# Patient Record
Sex: Male | Born: 1960 | Race: Black or African American | Hispanic: No | Marital: Married | Smoking: Never smoker
Health system: Southern US, Community
[De-identification: ages and names within clinical notes are randomized; demographics above are authoritative.]

## PROBLEM LIST (undated history)

## (undated) DIAGNOSIS — J302 Other seasonal allergic rhinitis: Secondary | ICD-10-CM

## (undated) DIAGNOSIS — I10 Essential (primary) hypertension: Secondary | ICD-10-CM

## (undated) HISTORY — PX: OTHER SURGICAL HISTORY: SHX169

## (undated) HISTORY — DX: Essential (primary) hypertension: I10

## (undated) HISTORY — PX: HERNIA REPAIR: SHX51

## (undated) HISTORY — DX: Other seasonal allergic rhinitis: J30.2

---

## 2005-07-06 ENCOUNTER — Ambulatory Visit: Payer: Self-pay | Admitting: Family Medicine

## 2005-07-12 ENCOUNTER — Ambulatory Visit: Payer: Self-pay | Admitting: Nurse Practitioner

## 2005-09-07 ENCOUNTER — Ambulatory Visit: Payer: Self-pay | Admitting: Nurse Practitioner

## 2005-11-23 ENCOUNTER — Ambulatory Visit: Payer: Self-pay | Admitting: Nurse Practitioner

## 2006-01-30 ENCOUNTER — Ambulatory Visit: Payer: Self-pay | Admitting: Nurse Practitioner

## 2006-07-19 ENCOUNTER — Ambulatory Visit: Payer: Self-pay | Admitting: Nurse Practitioner

## 2007-07-25 ENCOUNTER — Emergency Department (HOSPITAL_COMMUNITY): Admission: EM | Admit: 2007-07-25 | Discharge: 2007-07-25 | Payer: Self-pay | Admitting: Family Medicine

## 2007-08-06 ENCOUNTER — Emergency Department (HOSPITAL_COMMUNITY): Admission: EM | Admit: 2007-08-06 | Discharge: 2007-08-06 | Payer: Self-pay | Admitting: Emergency Medicine

## 2007-08-22 ENCOUNTER — Encounter (INDEPENDENT_AMBULATORY_CARE_PROVIDER_SITE_OTHER): Payer: Self-pay | Admitting: Nurse Practitioner

## 2007-08-22 ENCOUNTER — Ambulatory Visit: Payer: Self-pay | Admitting: Internal Medicine

## 2007-08-22 LAB — CONVERTED CEMR LAB
AST: 16 units/L (ref 0–37)
Alkaline Phosphatase: 28 units/L — ABNORMAL LOW (ref 39–117)
BUN: 16 mg/dL (ref 6–23)
Basophils Relative: 0 % (ref 0–1)
Creatinine, Ser: 0.97 mg/dL (ref 0.40–1.50)
Eosinophils Absolute: 0.5 10*3/uL (ref 0.0–0.7)
Eosinophils Relative: 9 % — ABNORMAL HIGH (ref 0–5)
Glucose, Bld: 208 mg/dL — ABNORMAL HIGH (ref 70–99)
HCT: 43.4 % (ref 39.0–52.0)
HDL: 48 mg/dL (ref >39)
Hemoglobin: 14.1 g/dL (ref 13.0–17.0)
LDL Cholesterol: 104 mg/dL — ABNORMAL HIGH (ref 0–99)
Lymphs Abs: 2.1 10*3/uL (ref 0.7–4.0)
MCHC: 32.5 g/dL (ref 30.0–36.0)
MCV: 91.4 fL (ref 78.0–100.0)
Monocytes Absolute: 0.3 10*3/uL (ref 0.1–1.0)
Monocytes Relative: 6 % (ref 3–12)
Neutrophils Relative %: 45 % (ref 43–77)
RBC: 4.75 M/uL (ref 4.22–5.81)
TSH: 1.073 microintl units/mL (ref 0.350–5.50)
Total CHOL/HDL Ratio: 3.4
Triglycerides: 56 mg/dL (ref <150)

## 2008-04-14 ENCOUNTER — Ambulatory Visit: Payer: Self-pay | Admitting: Family Medicine

## 2009-01-11 ENCOUNTER — Ambulatory Visit: Payer: Self-pay | Admitting: Internal Medicine

## 2009-01-11 ENCOUNTER — Encounter (INDEPENDENT_AMBULATORY_CARE_PROVIDER_SITE_OTHER): Payer: Self-pay | Admitting: Internal Medicine

## 2009-01-11 LAB — CONVERTED CEMR LAB
AST: 14 units/L (ref 0–37)
Albumin: 4.6 g/dL (ref 3.5–5.2)
Alkaline Phosphatase: 28 units/L — ABNORMAL LOW (ref 39–117)
BUN: 12 mg/dL (ref 6–23)
Calcium: 9.4 mg/dL (ref 8.4–10.5)
Chloride: 99 meq/L (ref 96–112)
Creatinine, Ser: 0.95 mg/dL (ref 0.40–1.50)
Glucose, Bld: 275 mg/dL — ABNORMAL HIGH (ref 70–99)
HDL: 71 mg/dL (ref >39)
PSA: 1.11 ng/mL (ref 0.10–4.00)
Potassium: 4.4 meq/L (ref 3.5–5.3)
Total CHOL/HDL Ratio: 2.7
Triglycerides: 62 mg/dL (ref <150)

## 2009-01-12 ENCOUNTER — Encounter (INDEPENDENT_AMBULATORY_CARE_PROVIDER_SITE_OTHER): Payer: Self-pay | Admitting: Internal Medicine

## 2009-01-25 ENCOUNTER — Ambulatory Visit: Payer: Self-pay | Admitting: Internal Medicine

## 2009-03-11 ENCOUNTER — Ambulatory Visit: Payer: Self-pay | Admitting: Internal Medicine

## 2011-04-18 ENCOUNTER — Ambulatory Visit: Payer: Worker's Compensation

## 2011-04-26 ENCOUNTER — Ambulatory Visit: Payer: Self-pay

## 2011-06-11 ENCOUNTER — Ambulatory Visit (INDEPENDENT_AMBULATORY_CARE_PROVIDER_SITE_OTHER): Payer: 59 | Admitting: Family Medicine

## 2011-06-11 VITALS — BP 136/82 | HR 76 | Temp 98.8°F | Resp 16 | Ht 69.0 in | Wt 211.2 lb

## 2011-06-11 DIAGNOSIS — R05 Cough: Secondary | ICD-10-CM

## 2011-06-11 DIAGNOSIS — J310 Chronic rhinitis: Secondary | ICD-10-CM

## 2011-06-11 DIAGNOSIS — R059 Cough, unspecified: Secondary | ICD-10-CM

## 2011-06-11 DIAGNOSIS — E119 Type 2 diabetes mellitus without complications: Secondary | ICD-10-CM

## 2011-06-11 DIAGNOSIS — I10 Essential (primary) hypertension: Secondary | ICD-10-CM

## 2011-06-11 LAB — POCT GLYCOSYLATED HEMOGLOBIN (HGB A1C): Hemoglobin A1C: 10.1

## 2011-06-11 MED ORDER — FLUTICASONE PROPIONATE 50 MCG/ACT NA SUSP: 2.0000 | Freq: Every day | NASAL | Status: DC

## 2011-06-11 MED ORDER — BENZONATATE 200 MG PO CAPS: 200.0000 mg | ORAL_CAPSULE | Freq: Two times a day (BID) | ORAL | Status: AC | PRN

## 2011-06-11 MED ORDER — GLYBURIDE 5 MG PO TABS: 5.0000 mg | ORAL_TABLET | Freq: Every day | ORAL | Status: DC

## 2011-06-11 MED ORDER — METFORMIN HCL 500 MG PO TABS: 1000.0000 mg | ORAL_TABLET | Freq: Two times a day (BID) | ORAL | Status: DC

## 2011-06-11 MED ORDER — LISINOPRIL 20 MG PO TABS: 20.0000 mg | ORAL_TABLET | Freq: Every day | ORAL | Status: DC

## 2011-06-11 NOTE — Progress Notes (Signed)
[facility]:  Office Visit  Chief Complaint:  Chief Complaint  Patient presents with  . Cough    x 2 days  . Diabetes    med refill  . Hypertension    med refill    HPI: Zachary Walsh is a 51 y.o. male who complains of :  1. Cough x 3 days with brown productive sputum, runny nose; no sinus pressure , no fevers, no ear pain, + sore throat but gargled with listerine and went away. + seasnal allergies but no asthma. Tried vicks vaopr rub.  Cough increases in the pm. 2. Diabetes and HTN med refill. Takes meds regular but ran out of glyburide. Takes BP at home 120-125/80 and blood sugars 200s due to not having glyburide. NO hypoglycemic event, lowest reading in last 30 days has been 98. No neuropathy, last podiatry visit 2 weeks ago, last eye exam < 1 year ago . Denies neuropathy.   Past Medical History  Diagnosis Date  . Hypertension   . Diabetes mellitus    Past Surgical History  Procedure Date  . Right leg fracture    History   Social History  . Marital Status: Married    Spouse Name: N/A    Number of Children: N/A  . Years of Education: N/A   Social History Main Topics  . Smoking status: Never Smoker   . Smokeless tobacco: None  . Alcohol Use: No  . Drug Use: No  . Sexually Active: None   Other Topics Concern  . None   Social History Narrative  . None   Family History  Problem Relation Age of Onset  . Heart disease Mother   . Heart disease Father   . Diabetes Father    Allergies  Allergen Reactions  . Benadryl (Altaryl) Anxiety   Prior to Admission medications   Medication Sig Start Date End Date Taking? Authorizing Provider  glyBURIDE (DIABETA) 5 MG tablet Take 5 mg by mouth daily with breakfast.   Yes Historical Provider, MD  lisinopril (PRINIVIL,ZESTRIL) 20 MG tablet Take 20 mg by mouth daily.   Yes Historical Provider, MD  metFORMIN (GLUCOPHAGE) 500 MG tablet Take 500 mg by mouth 3 (three) times daily with meals.   Yes Historical  Provider, MD     ROS: The patient denies fevers, chills, night sweats, unintentional weight loss, chest pain, palpitations, wheezing, dyspnea on exertion, nausea, vomiting, abdominal pain, dysuria, hematuria, melena, numbness, weakness, or tingling. + cough  All other systems have been reviewed and were otherwise negative with the exception of those mentioned in the HPI and as above.    PHYSICAL EXAM: Filed Vitals:   06/11/11 0825  BP: 136/82  Pulse: 76  Temp: 98.8 F (37.1 C)  Resp: 16   Filed Vitals:   06/11/11 0825  Height: 5\' 9"  (1.753 m)  Weight: 211 lb 3.2 oz (95.8 kg)   Body mass index is 31.19 kg/(m^2).  General: Alert, no acute distress HEENT:  Normocephalic, atraumatic, oropharynx patent.+ erythematous nasal nares, OP, no exudates. TM normal, no sinus tenderness Cardiovascular:  Regular rate and rhythm, no rubs murmurs or gallops.  No Carotid bruits, radial pulse intact. No pedal edema.  Respiratory: Clear to auscultation bilaterally.  No wheezes, rales, or rhonchi.  No cyanosis, no use of accessory musculature GI: No organomegaly, abdomen is soft and non-tender, positive bowel sounds.  No masses. Skin: No rashes. Neurologic: Facial musculature symmetric. Psychiatric: Patient is appropriate throughout our interaction. Lymphatic: No cervical  lymphadenopathy Musculoskeletal: Gait intact. [place] sensation intact     ASSESSMENT/PLAN: Encounter Diagnoses  Name Primary?  . Cough Yes  . Hypertension   . Diabetes mellitus   . Rhinitis    1. Rx flonase and [employer]. Pt declined Tussionex. 2. HTN-at goal. Refill Lisinopril #5, pt would like to get all labs in 3 months 3. T2DM-not well controlled > 200s, HbA1c today was 10.1 ( 1 point lower than last time) he has not been on meds for 1 month. Would like to get labs in 3 months after being on meds.  Eye and foot exam UTD. Refilled Gyburide #5, Increased Metformin from 500 mg TID to 1000 mg BID.   F/u in 3 months for  lab work, fasting , CMP, lipids, TSH, HbA1c.     Jaber Dunlow PHUONG, DO 06/11/2011 9:24 AM

## 2011-06-13 ENCOUNTER — Other Ambulatory Visit: Payer: Self-pay

## 2011-06-13 DIAGNOSIS — R05 Cough: Secondary | ICD-10-CM

## 2011-06-13 DIAGNOSIS — J310 Chronic rhinitis: Secondary | ICD-10-CM

## 2011-06-13 DIAGNOSIS — I10 Essential (primary) hypertension: Secondary | ICD-10-CM

## 2011-06-13 MED ORDER — METFORMIN HCL 500 MG PO TABS: 500.0000 mg | ORAL_TABLET | Freq: Two times a day (BID) | ORAL | Status: DC

## 2011-10-11 ENCOUNTER — Ambulatory Visit (INDEPENDENT_AMBULATORY_CARE_PROVIDER_SITE_OTHER): Payer: 59 | Admitting: Physician Assistant

## 2011-10-11 VITALS — BP 139/81 | HR 75 | Temp 98.0°F | Resp 18 | Ht 70.0 in | Wt 218.0 lb

## 2011-10-11 DIAGNOSIS — I10 Essential (primary) hypertension: Secondary | ICD-10-CM

## 2011-10-11 LAB — BASIC METABOLIC PANEL
CO2: 30 mEq/L (ref 19–32)
Chloride: 101 mEq/L (ref 96–112)
Glucose, Bld: 239 mg/dL — ABNORMAL HIGH (ref 70–99)
Sodium: 139 mEq/L (ref 135–145)

## 2011-10-11 LAB — POCT GLYCOSYLATED HEMOGLOBIN (HGB A1C): Hemoglobin A1C: 10.5

## 2011-10-11 MED ORDER — GLYBURIDE 5 MG PO TABS: 10.0000 mg | ORAL_TABLET | Freq: Every day | ORAL | Status: DC

## 2011-10-11 MED ORDER — METFORMIN HCL 1000 MG PO TABS: 1000.0000 mg | ORAL_TABLET | Freq: Two times a day (BID) | ORAL | Status: DC

## 2011-10-11 MED ORDER — LISINOPRIL-HYDROCHLOROTHIAZIDE 20-12.5 MG PO TABS: 1.0000 | ORAL_TABLET | Freq: Every day | ORAL | Status: DC

## 2011-10-11 NOTE — Progress Notes (Signed)
Patient ID: Zachary Walsh MRN: [id number], DOB: 12/07/1960, 51 y.o. Date of Encounter: 10/11/2011, 10:52 AM  Primary Physician: No primary provider on file.  Chief Complaint: Diabetes follow up  HPI: 51 y.o. year old male with history below presents for follow up of diabetes mellitus. Doing well. No issues or complaints. Taking medications daily without adverse effects. Out of his Metformin this morning. Blood sugars in the morning in the low 200's. Later in the day if he checks them are in the 120-140 range. Not eating healthy. Lots of late night snacking of sweets. Did recently join a gym to exercise. No polydipsia, polyphagia, polyuria, or nocturia.   He does request that I refill his medications to last until February, 2014 secondary to insurance reasons. He will have insurance at that time and plans to come in for a full CPE with fasting labs in February.  Last A1C: 10.1 in February, 2013  Eye MD: Up to date DDS: Up to date Podiatry: Up to date Influenza vaccine: Will need in the fall Pneumococcal vaccine: Needs   Past Medical History  Diagnosis Date  . Hypertension   . Diabetes mellitus      Home Meds: Prior to Admission medications   Medication Sig Start Date End Date Taking? Authorizing Provider  lisinopril (PRINIVIL,ZESTRIL) 20 MG tablet Take 1 tablet (20 mg total) by mouth daily. 06/11/11  Yes Thao P Le, DO  fluticasone (FLONASE) 50 MCG/ACT nasal spray Place 2 sprays into the nose daily. 06/11/11 07/08/12 No Thao P Le, DO  glyBURIDE (DIABETA) 5 MG tablet Take 2 tablets (10 mg total) by mouth daily with breakfast. 10/11/11 10/10/12 Yes Michalene Debruler M Travarus Trudo, PA-C  lisinopril-hydrochlorothiazide (ZESTORETIC) 20-12.5 MG per tablet Take 1 tablet by mouth daily. 10/11/11 10/10/12 Yes Lambros Cerro M Murriel Eidem, PA-C  metFORMIN (GLUCOPHAGE) 1000 MG tablet Take 1 tablet (1,000 mg total) by mouth 2 (two) times daily with a meal. 10/11/11 10/10/12 Yes Shylah Dossantos Adria Devon, PA-C    Allergies:  Allergies  Allergen  Reactions  . Benadryl (Diphenhydramine Hcl) Anxiety    History   Social History  . Marital Status: Married    Spouse Name: N/A    Number of Children: N/A  . Years of Education: N/A   Occupational History  . Not on file.   Social History Main Topics  . Smoking status: Never Smoker   . Smokeless tobacco: Not on file  . Alcohol Use: No  . Drug Use: No  . Sexually Active: Not on file   Other Topics Concern  . Not on file   Social History Narrative  . No narrative on file     Review of Systems: Constitutional: negative for chills, fever, night sweats, weight changes, or fatigue  HEENT: negative for vision changes, hearing loss, congestion, rhinorrhea, or epistaxis Cardiovascular: negative for chest pain, palpitations, diaphoresis, DOE, orthopnea, or edema Respiratory: negative for hemoptysis, wheezing, shortness of breath, dyspnea, or cough Abdominal: negative for abdominal pain, nausea, vomiting, diarrhea, or constipation Dermatological: negative for rash, erythema, or wounds Neurologic: negative for headache, dizziness, or syncope All other systems reviewed and are otherwise negative with the exception to those above and in the HPI.   Physical Exam: Blood pressure 139/81, pulse 75, temperature 98 F (36.7 C), temperature source Oral, resp. rate 18, height 5\' 10"  (1.778 m), weight 218 lb (98.884 kg), SpO2 98.00%., Body mass index is 31.28 kg/(m^2). General: Well developed, well nourished, in no acute distress. Head: Normocephalic, atraumatic, eyes without discharge, sclera non-icteric, nares  are without discharge. Bilateral auditory canals clear, TM's are without perforation, pearly grey and translucent with reflective cone of light bilaterally. Oral cavity moist, posterior pharynx without exudate, erythema, peritonsillar abscess, or post nasal drip.  Neck: Supple. No thyromegaly. Full ROM. No lymphadenopathy. No carotid bruits. Lungs: Clear bilaterally to auscultation  without wheezes, rales, or rhonchi. Breathing is unlabored. Heart: RRR with S1 S2. No murmurs, rubs, or gallops appreciated. Msk:  Strength and tone normal for age. Extremities/Skin: Warm and dry. No clubbing or cyanosis. No edema. No rashes, wounds, or suspicious lesions. Monofilament exam unremarkable bilaterally.  Neuro: Alert and oriented X 3. Moves all extremities spontaneously. Gait is normal. CNII-XII grossly in tact. Psych:  Responds to questions appropriately with a normal affect.   Labs: Results for orders placed in visit on 10/11/11  POCT GLYCOSYLATED HEMOGLOBIN (HGB A1C)      Component Value Range   Hemoglobin A1C 10.5    Last A1C 10.1 in February, 2013  BMP pending Declines all further lab testing  ASSESSMENT AND PLAN:  51 y.o. year old male with uncontrolled diabetes mellitus and hypertension -Patient is insistent on getting refills to last until Feb 2014 when he health insurance kicks in. He understands this is not how we usually follow diabetes, especially someone who is uncontrolled.  -Increase Metformin to 1000 mg 1 po bid #60 RF 7 -Increase Glyburide to 5 mg 2 po QAM #60 RF 7 -Add Lisinopril/HCTZ 20/12.5 mg 1 po daily #30 RF 7 -Take ASA 81 mg daily -Up to date with eye MD exam, podiatry, and DDS exam -Cut out all late night snacking -Healthy diet -Regular exercise -He does need a CPE including fasting labs -Discussed with him in detail this needs to be done. He most certainly needs to be on a statin, but declines this today, as well as lipid panel.  Signed, Eula Listen, PA-C 10/11/2011 10:52 AM

## 2012-01-11 ENCOUNTER — Emergency Department (HOSPITAL_COMMUNITY)
Admission: EM | Admit: 2012-01-11 | Discharge: 2012-01-11 | Disposition: A | Discharge status: Home / Self Care | Payer: No Typology Code available for payment source | Attending: Emergency Medicine | Admitting: Emergency Medicine

## 2012-01-11 ENCOUNTER — Emergency Department (HOSPITAL_COMMUNITY): Payer: No Typology Code available for payment source

## 2012-01-11 ENCOUNTER — Encounter (HOSPITAL_COMMUNITY): Payer: Self-pay

## 2012-01-11 DIAGNOSIS — E119 Type 2 diabetes mellitus without complications: Secondary | ICD-10-CM | POA: Insufficient documentation

## 2012-01-11 DIAGNOSIS — I10 Essential (primary) hypertension: Secondary | ICD-10-CM | POA: Insufficient documentation

## 2012-01-11 DIAGNOSIS — S161XXA Strain of muscle, fascia and tendon at neck level, initial encounter: Secondary | ICD-10-CM

## 2012-01-11 DIAGNOSIS — S139XXA Sprain of joints and ligaments of unspecified parts of neck, initial encounter: Secondary | ICD-10-CM | POA: Insufficient documentation

## 2012-01-11 MED ORDER — ACETAMINOPHEN 325 MG PO TABS
650.0000 mg | ORAL_TABLET | Freq: Once | ORAL | Status: AC
  Administered 2012-01-11: 650 mg via ORAL
  Filled 2012-01-11: qty 2
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
## 2012-05-30 ENCOUNTER — Ambulatory Visit: Payer: Self-pay

## 2012-06-06 ENCOUNTER — Emergency Department (HOSPITAL_COMMUNITY): Payer: Self-pay

## 2012-06-06 ENCOUNTER — Encounter (HOSPITAL_COMMUNITY): Payer: Self-pay | Admitting: Emergency Medicine

## 2012-06-06 ENCOUNTER — Emergency Department (HOSPITAL_COMMUNITY)
Admission: EM | Admit: 2012-06-06 | Discharge: 2012-06-06 | Disposition: A | Discharge status: Home / Self Care | Payer: Self-pay | Attending: Emergency Medicine | Admitting: Emergency Medicine

## 2012-06-06 DIAGNOSIS — E119 Type 2 diabetes mellitus without complications: Secondary | ICD-10-CM | POA: Insufficient documentation

## 2012-06-06 DIAGNOSIS — IMO0001 Reserved for inherently not codable concepts without codable children: Secondary | ICD-10-CM | POA: Insufficient documentation

## 2012-06-06 DIAGNOSIS — Z7982 Long term (current) use of aspirin: Secondary | ICD-10-CM | POA: Insufficient documentation

## 2012-06-06 DIAGNOSIS — J159 Unspecified bacterial pneumonia: Secondary | ICD-10-CM | POA: Insufficient documentation

## 2012-06-06 DIAGNOSIS — Z79899 Other long term (current) drug therapy: Secondary | ICD-10-CM | POA: Insufficient documentation

## 2012-06-06 DIAGNOSIS — J189 Pneumonia, unspecified organism: Secondary | ICD-10-CM

## 2012-06-06 DIAGNOSIS — R059 Cough, unspecified: Secondary | ICD-10-CM | POA: Insufficient documentation

## 2012-06-06 DIAGNOSIS — R05 Cough: Secondary | ICD-10-CM | POA: Insufficient documentation

## 2012-06-06 DIAGNOSIS — I10 Essential (primary) hypertension: Secondary | ICD-10-CM | POA: Insufficient documentation

## 2012-06-06 MED ORDER — METFORMIN HCL 1000 MG PO TABS: 1000.0000 mg | ORAL_TABLET | Freq: Two times a day (BID) | ORAL | Status: DC

## 2012-06-06 MED ORDER — GLYBURIDE 5 MG PO TABS: 10.0000 mg | ORAL_TABLET | Freq: Every day | ORAL | Status: DC

## 2012-06-06 MED ORDER — LISINOPRIL-HYDROCHLOROTHIAZIDE 20-12.5 MG PO TABS: 1.0000 | ORAL_TABLET | Freq: Every day | ORAL | Status: DC

## 2012-06-06 MED ORDER — ACETAMINOPHEN 325 MG PO TABS
650.0000 mg | ORAL_TABLET | Freq: Once | ORAL | Status: AC
  Administered 2012-06-06: 650 mg via ORAL
  Filled 2012-06-06: qty 2

## 2012-06-06 MED ORDER — AMOXICILLIN 500 MG PO CAPS: 1000.0000 mg | ORAL_CAPSULE | Freq: Two times a day (BID) | ORAL | Status: DC

## 2012-06-06 MED ORDER — AMOXICILLIN 500 MG PO CAPS
1000.0000 mg | ORAL_CAPSULE | Freq: Once | ORAL | Status: AC
  Administered 2012-06-06: 1000 mg via ORAL
  Filled 2012-06-06 (×2): qty 1

## 2012-06-06 NOTE — ED Provider Notes (Signed)
History     CSN: [id number]  Arrival date & time 06/06/12  1215   First MD Initiated Contact with Patient 06/06/12 1544      Chief Complaint  Patient presents with  . flu like symptoms     (Consider location/radiation/quality/duration/timing/severity/associated sxs/prior treatment) The history is provided by the patient.   52 year old male had onset yesterday of fever, chills, cough, body aches. Pain is moderate and he rates it at 6/10. Cough is productive of brown sputum. He denies dyspnea. He did not take his temperature at noon he felt hot. He denies having any sick contacts. He denies nausea vomiting or diarrhea. He has not taken any medication.  Past Medical History  Diagnosis Date  . Hypertension   . Diabetes mellitus     Past Surgical History  Procedure Laterality Date  . Right leg fracture      Family History  Problem Relation Age of Onset  . Heart disease Mother   . Heart disease Father   . Diabetes Father     History  Substance Use Topics  . Smoking status: Never Smoker   . Smokeless tobacco: Not on file  . Alcohol Use: No      Review of Systems  All other systems reviewed and are negative.    Allergies  Benadryl  Home Medications   Current Outpatient Rx  Name  Route  Sig  Dispense  Refill  . aspirin 81 MG chewable tablet   Oral   Chew 81 mg by mouth daily.         . fluticasone (FLONASE) 50 MCG/ACT nasal spray   Nasal   Place 2 sprays into the nose daily as needed for allergies.         Marland Kitchen glyBURIDE (DIABETA) 5 MG tablet   Oral   Take 2 tablets (10 mg total) by mouth daily with breakfast.   60 tablet   7   . lisinopril-hydrochlorothiazide (ZESTORETIC) 20-12.5 MG per tablet   Oral   Take 1 tablet by mouth daily.   30 tablet   7   . metFORMIN (GLUCOPHAGE) 1000 MG tablet   Oral   Take 1 tablet (1,000 mg total) by mouth 2 (two) times daily with a meal.   60 tablet   7     BP 104/60  Pulse 83  Temp(Src) 100.6 F (38.1 C)  (Oral)  Resp 18  SpO2 97%  Physical Exam  Nursing note and vitals reviewed.  52 year old male, resting comfortably and in no acute distress. Vital signs are significant for fever with temperature 100.6. Oxygen saturation is 97%, which is normal. Head is normocephalic and atraumatic. PERRLA, EOMI. Oropharynx is clear. Neck is nontender and supple without adenopathy or JVD. Back is nontender and there is no CVA tenderness. Lungs are clear without rales, wheezes, or rhonchi. Chest is nontender. Heart has regular rate and rhythm without murmur. Abdomen is soft, flat, nontender without masses or hepatosplenomegaly and peristalsis is normoactive. Extremities have no cyanosis or edema, full range of motion is present. Skin is warm and dry without rash. Neurologic: Mental status is normal, cranial nerves are intact, there are no motor or sensory deficits.  ED Course  Procedures (including critical care time)  Labs Reviewed - No data to display Dg Chest 2 View  06/06/2012  *RADIOLOGY REPORT*  Clinical Data: Cough, fever  CHEST - 2 VIEW  Comparison: None.  Findings: Cardiomediastinal silhouette is unremarkable.  Mild elevation of the right hemidiaphragm.  Right basilar atelectasis or infiltrate.  No pulmonary edema.  IMPRESSION: No pulmonary edema.  Elevation of the right hemidiaphragm.  Right basilar atelectasis or infiltrate.   Original Report Authenticated By: Natasha Mead, M.D.    Images viewed by me.  1. Community acquired pneumonia       MDM  Community acquired pneumonia. I reviewed the x-rays and appearance is much more consistent with pneumonia than atelectasis especially in conjunction with his clinical presentation. He is given a prescription for amoxicillin. He also states that he will not be seeing his new PCP until sometime next month and requests refills of his prescriptions. Refills are given for a one-month supply of glyburide, lisinopril-hydrochlorothiazide, and metformin. He is  given a work release for the next 2 days.        Dione Booze, MD 06/06/12 [phone]

## 2012-06-06 NOTE — ED Notes (Signed)
Per patient, flu like symptoms since yesterday-productive cough, chills, head ache

## 2012-06-13 NOTE — ED Notes (Addendum)
Patient called requesting extension of work note.Note extended, to return to work on 06/14/2012

## 2012-08-03 ENCOUNTER — Telehealth: Payer: Self-pay

## 2012-08-03 NOTE — Telephone Encounter (Signed)
PT CALLED IN REGARDS TO A REFILL FOR HIS DIABETES AND HIGH BLOOD PRESSURE MEDICATION.

## 2012-08-03 NOTE — Telephone Encounter (Signed)
Does he need to RTC? 

## 2012-08-04 MED ORDER — METFORMIN HCL 1000 MG PO TABS: 1000.0000 mg | ORAL_TABLET | Freq: Two times a day (BID) | ORAL | Status: DC

## 2012-08-04 MED ORDER — LISINOPRIL-HYDROCHLOROTHIAZIDE 20-12.5 MG PO TABS: 1.0000 | ORAL_TABLET | Freq: Every day | ORAL | Status: DC

## 2012-08-04 MED ORDER — GLYBURIDE 5 MG PO TABS: 10.0000 mg | ORAL_TABLET | Freq: Every day | ORAL | Status: DC

## 2012-08-04 NOTE — Telephone Encounter (Signed)
Called him to advise meds sent in needs follow up.

## 2012-08-04 NOTE — Telephone Encounter (Signed)
I have sent a 1 month supply, but pt needs appt and labs for further refills.

## 2013-02-11 ENCOUNTER — Encounter (HOSPITAL_COMMUNITY): Payer: Self-pay | Admitting: Emergency Medicine

## 2013-02-11 ENCOUNTER — Emergency Department (HOSPITAL_COMMUNITY)
Admission: EM | Admit: 2013-02-11 | Discharge: 2013-02-11 | Disposition: A | Discharge status: Home / Self Care | Payer: 59 | Attending: Emergency Medicine | Admitting: Emergency Medicine

## 2013-02-11 ENCOUNTER — Emergency Department (HOSPITAL_COMMUNITY): Payer: 59

## 2013-02-11 DIAGNOSIS — I10 Essential (primary) hypertension: Secondary | ICD-10-CM | POA: Insufficient documentation

## 2013-02-11 DIAGNOSIS — E119 Type 2 diabetes mellitus without complications: Secondary | ICD-10-CM | POA: Insufficient documentation

## 2013-02-11 DIAGNOSIS — Z792 Long term (current) use of antibiotics: Secondary | ICD-10-CM | POA: Insufficient documentation

## 2013-02-11 DIAGNOSIS — Z79899 Other long term (current) drug therapy: Secondary | ICD-10-CM | POA: Insufficient documentation

## 2013-02-11 DIAGNOSIS — L089 Local infection of the skin and subcutaneous tissue, unspecified: Secondary | ICD-10-CM | POA: Insufficient documentation

## 2013-02-11 DIAGNOSIS — Z7982 Long term (current) use of aspirin: Secondary | ICD-10-CM | POA: Insufficient documentation

## 2013-02-11 NOTE — ED Notes (Signed)
Pt states that his left 4th toe was sore on Sat; pt states that he does not remember an injury; pt states that he saw his MD on Monday and was diagnosed with an infection to toe and was placed on an antibiotic (pt does not remember the name); pt states that his toe remains painful and swollen.

## 2013-02-11 NOTE — ED Provider Notes (Signed)
Medical screening examination/treatment/procedure(s) were performed by non-physician practitioner and as supervising physician I was immediately available for consultation/collaboration.  Jennifr Gaeta, MD 02/11/13 0651 

## 2013-02-11 NOTE — ED Provider Notes (Signed)
CSN: [id number]     Arrival date & time 02/11/13  4098 History   First MD Initiated Contact with Patient 02/11/13 [phone]     Chief Complaint  Patient presents with  . Toe Pain   HPI  History provided by the patient. Patient is a 52 year old male with history of hypertension and diabetes who presents with complaints of continued and worsened left toe pain. Patient reports having increasing toe pain first began over the past weekend 4 or 5 days ago. He was seen 2 days ago, Monday at a local [facility] and diagnosed with possible infection of his left fourth toe. He reports being given prescriptions for an antibiotic, platelet the skin of the toes and pain medicine. He has been taking these but tonight reports worsened pain in the toe. He states he is worried about toe being broken. He does not remember hitting the toe or having any injury prior to symptoms. Patient does walk a lot as he works for the [employer]. No other aggravating or alleviating factors. No other associated symptoms.      Past Medical History  Diagnosis Date  . Hypertension   . Diabetes mellitus    Past Surgical History  Procedure Laterality Date  . Right leg fracture    . Hernia repair     Family History  Problem Relation Age of Onset  . Heart disease Mother   . Heart disease Father   . Diabetes Father    History  Substance Use Topics  . Smoking status: Never Smoker   . Smokeless tobacco: Not on file  . Alcohol Use: No    Review of Systems  Constitutional: Negative for fever, chills and diaphoresis.  Neurological: Negative for weakness and numbness.  All other systems reviewed and are negative.    Allergies  Benadryl  Home Medications   Current Outpatient Rx  Name  Route  Sig  Dispense  Refill  . amoxicillin (AMOXIL) 500 MG capsule   Oral   Take 2 capsules (1,000 mg total) by mouth 2 (two) times daily.   40 capsule   0   . aspirin 81 MG chewable tablet   Oral   Chew 81 mg by mouth daily.        . fluticasone (FLONASE) 50 MCG/ACT nasal spray   Nasal   Place 2 sprays into the nose daily as needed for allergies.         Marland Kitchen EXPIRED: glyBURIDE (DIABETA) 5 MG tablet   Oral   Take 2 tablets (10 mg total) by mouth daily with breakfast.   60 tablet   7   . glyBURIDE (DIABETA) 5 MG tablet   Oral   Take 2 tablets (10 mg total) by mouth daily with breakfast.   60 tablet   0   . lisinopril-hydrochlorothiazide (PRINZIDE) 20-12.5 MG per tablet   Oral   Take 1 tablet by mouth daily.   30 tablet   0   . EXPIRED: lisinopril-hydrochlorothiazide (ZESTORETIC) 20-12.5 MG per tablet   Oral   Take 1 tablet by mouth daily.   30 tablet   7   . EXPIRED: metFORMIN (GLUCOPHAGE) 1000 MG tablet   Oral   Take 1 tablet (1,000 mg total) by mouth 2 (two) times daily with a meal.   60 tablet   7   . metFORMIN (GLUCOPHAGE) 1000 MG tablet   Oral   Take 1 tablet (1,000 mg total) by mouth 2 (two) times daily.   60 tablet  0    BP 146/74  Pulse 73  Temp(Src) 98.9 F (37.2 C) (Oral)  Resp 18  SpO2 99% Physical Exam  Nursing note and vitals reviewed. Constitutional: He is oriented to person, place, and time. He appears well-developed and well-nourished. No distress.  HENT:  Head: Normocephalic.  Cardiovascular: Normal rate and regular rhythm.   Pulmonary/Chest: Effort normal and breath sounds normal.  Abdominal: Soft.  Musculoskeletal:  Diffuse swelling of the left 4th toe with erythema.  Neurological: He is alert and oriented to person, place, and time.  Skin: Skin is warm.  Psychiatric: He has a normal mood and affect. His behavior is normal.    ED Course  Procedures   Patient seen and evaluated. Patient well-appearing no acute distress. Appear severely ill or toxic. He is concerned for other injury to costosternal pain and swelling.  X-rays unremarkable. No signs of fractures, dislocations or osteomyelitis. At this time I have instructed patient to continue Keflex. He has  only been using for one day and this is unlikely to have enough time to take full effect. Patient was also given prescription for pain medicine but does not recall the name. At this time have instructed him to continue the same medicines and recheck if symptoms on Friday.    Imaging Review Dg Foot Complete Left  02/11/2013   CLINICAL DATA:  Left 4th toe pain. No known injury.  EXAM: LEFT FOOT - COMPLETE 3+ VIEW  COMPARISON:  None.  FINDINGS: There is no evidence of fracture or dislocation. There is no evidence of arthropathy or other focal bone abnormality. Soft tissues are unremarkable.  IMPRESSION: Negative.   Electronically Signed   By: Burman Nieves M.D.   On: 02/11/2013 05:06      MDM   1. Toe infection        Angus Seller, PA-C 02/11/13 [phone]

## 2013-11-15 ENCOUNTER — Ambulatory Visit (INDEPENDENT_AMBULATORY_CARE_PROVIDER_SITE_OTHER): Payer: 59 | Admitting: Family Medicine

## 2013-11-15 VITALS — BP 140/74 | HR 74 | Temp 97.5°F | Resp 20 | Ht 70.0 in | Wt 215.0 lb

## 2013-11-15 DIAGNOSIS — E119 Type 2 diabetes mellitus without complications: Secondary | ICD-10-CM

## 2013-11-15 DIAGNOSIS — N529 Male erectile dysfunction, unspecified: Secondary | ICD-10-CM

## 2013-11-15 LAB — COMPREHENSIVE METABOLIC PANEL
ALT: 12 U/L (ref 0–53)
AST: 11 U/L (ref 0–37)
Albumin: 4.2 g/dL (ref 3.5–5.2)
Alkaline Phosphatase: 29 U/L — ABNORMAL LOW (ref 39–117)
BUN: 16 mg/dL (ref 6–23)
CO2: 27 mEq/L (ref 19–32)
Calcium: 9.8 mg/dL (ref 8.4–10.5)
Chloride: 98 mEq/L (ref 96–112)
Creat: 0.93 mg/dL (ref 0.50–1.35)
Glucose, Bld: 302 mg/dL — ABNORMAL HIGH (ref 70–99)
Potassium: 4.3 mEq/L (ref 3.5–5.3)
Sodium: 135 mEq/L (ref 135–145)
Total Bilirubin: 0.7 mg/dL (ref 0.2–1.2)
Total Protein: 7.3 g/dL (ref 6.0–8.3)

## 2013-11-15 LAB — LIPID PANEL
Cholesterol: 237 mg/dL — ABNORMAL HIGH (ref 0–200)
HDL: 65 mg/dL (ref >39)
LDL Cholesterol: 148 mg/dL — ABNORMAL HIGH (ref 0–99)
Total CHOL/HDL Ratio: 3.6 Ratio
Triglycerides: 119 mg/dL (ref <150)
VLDL: 24 mg/dL (ref 0–40)

## 2013-11-15 LAB — POCT GLYCOSYLATED HEMOGLOBIN (HGB A1C): Hemoglobin A1C: 11.2

## 2013-11-15 LAB — GLUCOSE, POCT (MANUAL RESULT ENTRY): POC Glucose: 286 mg/dl — AB (ref 70–99)

## 2013-11-15 LAB — MICROALBUMIN, URINE: Microalb, Ur: 2.76 mg/dL — ABNORMAL HIGH (ref 0.00–1.89)

## 2013-11-15 MED ORDER — SILDENAFIL CITRATE 100 MG PO TABS: 100.0000 mg | ORAL_TABLET | Freq: Every day | ORAL | Status: DC | PRN

## 2013-11-15 MED ORDER — LISINOPRIL-HYDROCHLOROTHIAZIDE 20-12.5 MG PO TABS: 1.0000 | ORAL_TABLET | Freq: Every day | ORAL | Status: DC

## 2013-11-15 MED ORDER — SITAGLIPTIN PHOSPHATE 50 MG PO TABS: 50.0000 mg | ORAL_TABLET | Freq: Every day | ORAL | Status: DC

## 2013-11-15 MED ORDER — METFORMIN HCL 1000 MG PO TABS: 1000.0000 mg | ORAL_TABLET | Freq: Two times a day (BID) | ORAL | Status: DC

## 2013-11-15 MED ORDER — GLYBURIDE 5 MG PO TABS: 10.0000 mg | ORAL_TABLET | Freq: Every day | ORAL | Status: DC

## 2013-11-15 NOTE — Progress Notes (Signed)
Patient ID: Zachary Walsh MRN: [id number], DOB: 1960-04-26, 53 y.o. Date of Encounter: 11/15/2013, 1:03 PM  Primary Physician: No primary provider on file.  Chief Complaint: Diabetes follow up  HPI: 53 y.o. year old male with history below presents for follow up of diabetes mellitus. Doing well. No issues or complaints. Taking medications daily without adverse effects. No polydipsia, polyphagia, polyuria, or nocturia.  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  He's drinking large amount of gatorade  Last check over a year ago.   Past Medical History  Diagnosis Date  . Hypertension   . Diabetes mellitus   . Seasonal allergies      Home Meds: Prior to Admission medications   Medication Sig Start Date End Date Taking? Authorizing Provider  aspirin 81 MG chewable tablet Chew 81 mg by mouth daily.   Yes Historical Provider, MD  glyBURIDE (DIABETA) 5 MG tablet Take 2 tablets (10 mg total) by mouth daily with breakfast. 11/15/13  Yes Elvina SidleKurt Debanhi Blaker, MD  lisinopril-hydrochlorothiazide (PRINZIDE,ZESTORETIC) 20-12.5 MG per tablet Take 1 tablet by mouth daily. 11/15/13  Yes Elvina SidleKurt Teila Skalsky, MD  metFORMIN (GLUCOPHAGE) 1000 MG tablet Take 1 tablet (1,000 mg total) by mouth 2 (two) times daily with a meal. 11/15/13  Yes Elvina SidleKurt Makenlee Mckeag, MD  sildenafil (VIAGRA) 100 MG tablet Take 1 tablet (100 mg total) by mouth daily as needed for erectile dysfunction. 11/15/13  Yes Elvina SidleKurt Aryon Nham, MD    Allergies:  Allergies  Allergen Reactions  . Benadryl [Diphenhydramine Hcl] Anxiety    History   Social History  . Marital Status: Married    Spouse Name: N/A    Number of Children: N/A  . Years of Education: N/A   Occupational History  . Not on file.   Social History Main Topics  . Smoking status: Never Smoker   . Smokeless tobacco: Not on file  . Alcohol Use: No  . Drug Use: No  . Sexual  Activity: Not on file   Other Topics Concern  . Not on file   Social History Narrative  . No narrative on file     Lab Results  Component Value Date   HGBA1C 10.5 10/11/2011    Review of Systems: Constitutional: negative for chills, fever, night sweats, weight changes, or fatigue  HEENT: negative for vision changes, hearing loss, congestion, rhinorrhea, or epistaxis Cardiovascular: negative for chest pain, palpitations, diaphoresis, DOE, orthopnea, or edema Respiratory: negative for hemoptysis, wheezing, shortness of breath, dyspnea, or cough Abdominal: negative for abdominal pain, nausea, vomiting, diarrhea, or constipation Dermatological: negative for rash, erythema, or wounds Neurologic: negative for headache, dizziness, or syncope Renal:  Negative for polyuria, polydipsia, or dysuria All other systems reviewed and are otherwise negative with the exception to those above and in the HPI.   Physical Exam: Blood pressure 140/74, pulse 74, temperature 97.5 F (36.4 C), temperature source Oral, resp. rate 20, height 5\' 10"  (1.778 m), weight 215 lb (97.523 kg), SpO2 98.00%., Body mass index is 30.85 kg/(m^2). Wt Readings from Last 3 Encounters:  11/15/13 215 lb (97.523 kg)  10/11/11 218 lb (98.884 kg)  06/11/11 211 lb 3.2 oz (95.8 kg)   BP Readings from Last 3 Encounters:  11/15/13 140/74  02/11/13 133/79  06/06/12 104/60   General: Well developed, well nourished, in no acute distress. Head: Normocephalic, atraumatic, eyes without discharge, sclera non-icteric, nares are without discharge. Bilateral auditory  canals clear, TM's are without perforation, pearly grey and translucent with reflective cone of light bilaterally. Oral cavity moist, posterior pharynx without exudate, erythema, peritonsillar abscess, or post nasal drip.  Neck: Supple. No thyromegaly. Full ROM. No lymphadenopathy. Lungs: Clear bilaterally to auscultation without wheezes, rales, or rhonchi. Breathing is  unlabored. Heart: RRR with S1 S2. No murmurs, rubs, or gallops appreciated. Abdomen: Soft, non-tender, non-distended with normoactive bowel sounds. No hepatosplenomegaly. No rebound/guarding. No obvious abdominal masses. Msk:  Strength and tone normal for age. Extremities/Skin: Warm and dry. No clubbing or cyanosis. No edema. No rashes, wounds, or suspicious lesions. Monofilament exam normal.  Neuro: Alert and oriented X 3. Moves all extremities spontaneously. Gait is normal. CNII-XII grossly in tact. Psych:  Responds to questions appropriately with a normal affect.   Labs: Results for orders placed in visit on 11/15/13  POCT GLYCOSYLATED HEMOGLOBIN (HGB A1C)      Result Value Ref Range   Hemoglobin A1C 11.2    GLUCOSE, POCT (MANUAL RESULT ENTRY)      Result Value Ref Range   POC Glucose 286 (*) 70 - 99 mg/dl    ASSESSMENT AND PLAN:  53 y.o. year old male with uncontrolled diabetes -Type 2 diabetes mellitus without complication - Plan: glyBURIDE (DIABETA) 5 MG tablet, lisinopril-hydrochlorothiazide (PRINZIDE,ZESTORETIC) 20-12.5 MG per tablet, metFORMIN (GLUCOPHAGE) 1000 MG tablet, Microalbumin, urine, POCT glycosylated hemoglobin (Hb A1C), POCT glucose (manual entry), Comprehensive metabolic panel, Lipid panel, sitaGLIPtin (JANUVIA) 50 MG tablet  Erectile dysfunction, unspecified erectile dysfunction type - Plan: sildenafil (VIAGRA) 100 MG tablet  Recheck 3 months.  Cool it on the gatorade.  Add Januvia  Signed, Elvina Sidle, MD 11/15/2013 1:03 PM

## 2013-11-15 NOTE — Patient Instructions (Signed)
Diabetes and Exercise Exercising regularly is important. It is not just about losing weight. It has many health benefits, such as:  Improving your overall fitness, flexibility, and endurance.  Increasing your bone density.  Helping with weight control.  Decreasing your body fat.  Increasing your muscle strength.  Reducing stress and tension.  Improving your overall health. People with diabetes who exercise gain additional benefits because exercise:  Reduces appetite.  Improves the body's use of blood sugar (glucose).  Helps lower or control blood glucose.  Decreases blood pressure.  Helps control blood lipids (such as cholesterol and triglycerides).  Improves the body's use of the hormone insulin by:  Increasing the body's insulin sensitivity.  Reducing the body's insulin needs.  Decreases the risk for heart disease because exercising:  Lowers cholesterol and triglycerides levels.  Increases the levels of good cholesterol (such as high-density lipoproteins [HDL]) in the body.  Lowers blood glucose levels. YOUR ACTIVITY PLAN  Choose an activity that you enjoy and set realistic goals. Your health care provider or diabetes educator can help you make an activity plan that works for you. Exercise regularly as directed by your health care provider. This includes:  Performing resistance training twice a week such as push-ups, sit-ups, lifting weights, or using resistance bands.  Performing 150 minutes of cardio exercises each week such as walking, running, or playing sports.  Staying active and spending no more than 90 minutes at one time being inactive. Even short bursts of exercise are good for you. Three 10-minute sessions spread throughout the day are just as beneficial as a single 30-minute session. Some exercise ideas include:  Taking the dog for a walk.  Taking the stairs instead of the elevator.  Dancing to your favorite song.  Doing an exercise  video.  Doing your favorite exercise with a friend. RECOMMENDATIONS FOR EXERCISING WITH TYPE 1 OR TYPE 2 DIABETES   Check your blood glucose before exercising. If blood glucose levels are greater than 240 mg/dL, check for urine ketones. Do not exercise if ketones are present.  Avoid injecting insulin into areas of the body that are going to be exercised. For example, avoid injecting insulin into:  The arms when playing tennis.  The legs when jogging.  Keep a record of:  Food intake before and after you exercise.  Expected peak times of insulin action.  Blood glucose levels before and after you exercise.  The type and amount of exercise you have done.  Review your records with your health care provider. Your health care provider will help you to develop guidelines for adjusting food intake and insulin amounts before and after exercising.  If you take insulin or oral hypoglycemic agents, watch for signs and symptoms of hypoglycemia. They include:  Dizziness.  Shaking.  Sweating.  Chills.  Confusion.  Drink plenty of water while you exercise to prevent dehydration or heat stroke. Body water is lost during exercise and must be replaced.  Talk to your health care provider before starting an exercise program to make sure it is safe for you. Remember, almost any type of activity is better than none. Document Released: 06/30/2003 Document Revised: 08/24/2013 Document Reviewed: 09/16/2012 ExitCare Patient Information 2015 [employer]. This information is not intended to replace advice given to you by your health care provider. Make sure you discuss any questions you have with your health care provider.  

## 2014-04-10 ENCOUNTER — Encounter (HOSPITAL_COMMUNITY): Payer: Self-pay | Admitting: Emergency Medicine

## 2014-04-10 ENCOUNTER — Emergency Department (HOSPITAL_COMMUNITY)
Admission: EM | Admit: 2014-04-10 | Discharge: 2014-04-10 | Disposition: A | Discharge status: Home / Self Care | Payer: 59 | Attending: Emergency Medicine | Admitting: Emergency Medicine

## 2014-04-10 ENCOUNTER — Emergency Department (HOSPITAL_COMMUNITY): Payer: 59

## 2014-04-10 DIAGNOSIS — I10 Essential (primary) hypertension: Secondary | ICD-10-CM | POA: Insufficient documentation

## 2014-04-10 DIAGNOSIS — B349 Viral infection, unspecified: Secondary | ICD-10-CM | POA: Insufficient documentation

## 2014-04-10 DIAGNOSIS — Z7982 Long term (current) use of aspirin: Secondary | ICD-10-CM | POA: Insufficient documentation

## 2014-04-10 DIAGNOSIS — E119 Type 2 diabetes mellitus without complications: Secondary | ICD-10-CM | POA: Diagnosis not present

## 2014-04-10 DIAGNOSIS — Z79899 Other long term (current) drug therapy: Secondary | ICD-10-CM | POA: Diagnosis not present

## 2014-04-10 DIAGNOSIS — Z8709 Personal history of other diseases of the respiratory system: Secondary | ICD-10-CM | POA: Insufficient documentation

## 2014-04-10 DIAGNOSIS — R110 Nausea: Secondary | ICD-10-CM | POA: Diagnosis present

## 2014-04-10 LAB — URINALYSIS, ROUTINE W REFLEX MICROSCOPIC
Glucose, UA: >1000 mg/dL — AB
HGB URINE DIPSTICK: NEGATIVE
KETONES UR: NEGATIVE mg/dL
Leukocytes, UA: NEGATIVE
Nitrite: NEGATIVE
PROTEIN: NEGATIVE mg/dL
SPECIFIC GRAVITY, URINE: 1.029 (ref 1.005–1.030)
UROBILINOGEN UA: 1 mg/dL (ref 0.0–1.0)
pH: 5.5 (ref 5.0–8.0)

## 2014-04-10 LAB — CBC WITH DIFFERENTIAL/PLATELET
Basophils Absolute: 0 10*3/uL (ref 0.0–0.1)
Basophils Relative: 0 % (ref 0–1)
EOS ABS: 0.2 10*3/uL (ref 0.0–0.7)
EOS PCT: 3 % (ref 0–5)
HCT: 38.4 % — ABNORMAL LOW (ref 39.0–52.0)
HEMOGLOBIN: 13.4 g/dL (ref 13.0–17.0)
Lymphocytes Relative: 17 % (ref 12–46)
Lymphs Abs: 1 10*3/uL (ref 0.7–4.0)
MCH: 31 pg (ref 26.0–34.0)
MCHC: 34.9 g/dL (ref 30.0–36.0)
MCV: 88.9 fL (ref 78.0–100.0)
MONOS PCT: 5 % (ref 3–12)
Monocytes Absolute: 0.3 10*3/uL (ref 0.1–1.0)
Neutro Abs: 4.3 10*3/uL (ref 1.7–7.7)
Neutrophils Relative %: 75 % (ref 43–77)
PLATELETS: 201 10*3/uL (ref 150–400)
RBC: 4.32 MIL/uL (ref 4.22–5.81)
RDW: 12.6 % (ref 11.5–15.5)
WBC: 5.8 10*3/uL (ref 4.0–10.5)

## 2014-04-10 LAB — BASIC METABOLIC PANEL
Anion gap: 13 (ref 5–15)
BUN: 17 mg/dL (ref 6–23)
CO2: 27 mEq/L (ref 19–32)
CREATININE: 1.05 mg/dL (ref 0.50–1.35)
Calcium: 8.8 mg/dL (ref 8.4–10.5)
Chloride: 95 mEq/L — ABNORMAL LOW (ref 96–112)
GFR calc Af Amer: >90 mL/min (ref >90)
GFR, EST NON AFRICAN AMERICAN: 80 mL/min — AB (ref >90)
Glucose, Bld: 267 mg/dL — ABNORMAL HIGH (ref 70–99)
POTASSIUM: 4.1 meq/L (ref 3.7–5.3)
Sodium: 135 mEq/L — ABNORMAL LOW (ref 137–147)

## 2014-04-10 LAB — CBG MONITORING, ED: Glucose-Capillary: 268 mg/dL — ABNORMAL HIGH (ref 70–99)

## 2014-04-10 LAB — URINE MICROSCOPIC-ADD ON

## 2014-04-10 MED ORDER — ONDANSETRON 8 MG PO TBDP
8.0000 mg | ORAL_TABLET | Freq: Once | ORAL | Status: AC
  Administered 2014-04-10: 8 mg via ORAL
  Filled 2014-04-10: qty 1

## 2014-04-10 MED ORDER — IBUPROFEN 200 MG PO TABS
600.0000 mg | ORAL_TABLET | Freq: Once | ORAL | Status: AC
  Administered 2014-04-10: 600 mg via ORAL
  Filled 2014-04-10: qty 3

## 2014-04-10 MED ORDER — ONDANSETRON 4 MG PO TBDP: ORAL_TABLET | ORAL | Status: DC

## 2014-04-10 NOTE — ED Notes (Signed)
Pt from home, was accompanying daughter who was being tx for nausea. Pt started to feel nauseous and faint. Pt laid in bed and sts that he needed to check in to be seen. Pt denies emesis, only nausea. Pt is A&O and in NAD

## 2014-04-10 NOTE — ED Notes (Signed)
Pt sts that he is not a Jehovah's Witness but he will still not accept blood

## 2014-04-10 NOTE — ED Provider Notes (Signed)
CSN: [id number]     Arrival date & time 04/10/14  1638 History   First MD Initiated Contact with Patient 04/10/14 1707     Chief Complaint  Patient presents with  . Nausea     (Consider location/radiation/quality/duration/timing/severity/associated sxs/prior Treatment) HPI Comments: Patient presents with nausea and fatigue. He has a history of hypertension and diabetes. He's actually here in emergency department with his daughter who has gastroenteritis. He started feeling fatigued this morning and then while he was here in the ER started feeling nauseated and had some diarrhea. He also is noted to have a fever of 101. He's been having some cough and chest congestion. He denies any urinary symptoms. He denies any shortness of breath. He denies any sore throat. He denies abdominal pain.   Past Medical History  Diagnosis Date  . Hypertension   . Diabetes mellitus   . Seasonal allergies    Past Surgical History  Procedure Laterality Date  . Right leg fracture    . Hernia repair     Family History  Problem Relation Age of Onset  . Heart disease Mother   . Heart disease Father   . Diabetes Father   . Heart failure Mother   . Heart failure Father    History  Substance Use Topics  . Smoking status: Never Smoker   . Smokeless tobacco: Not on file  . Alcohol Use: No    Review of Systems  Constitutional: Positive for fever and fatigue. Negative for chills and diaphoresis.  HENT: Negative for congestion, rhinorrhea and sneezing.   Eyes: Negative.   Respiratory: Positive for cough. Negative for chest tightness and shortness of breath.   Cardiovascular: Negative for chest pain and leg swelling.  Gastrointestinal: Positive for nausea and diarrhea. Negative for vomiting, abdominal pain and blood in stool.  Genitourinary: Negative for frequency, hematuria, flank pain and difficulty urinating.  Musculoskeletal: Negative for back pain and arthralgias.  Skin: Negative for rash.   Neurological: Negative for dizziness, speech difficulty, weakness, numbness and headaches.      Allergies  Benadryl  Home Medications   Prior to Admission medications   Medication Sig Start Date End Date Taking? Authorizing Provider  aspirin 81 MG chewable tablet Chew 81 mg by mouth daily.   Yes Historical Provider, MD  glyBURIDE (DIABETA) 5 MG tablet Take 2 tablets (10 mg total) by mouth daily with breakfast. 11/15/13  Yes Elvina Sidle, MD  lisinopril-hydrochlorothiazide (PRINZIDE,ZESTORETIC) 20-12.5 MG per tablet Take 1 tablet by mouth daily. 11/15/13  Yes Elvina Sidle, MD  metFORMIN (GLUCOPHAGE) 1000 MG tablet Take 1 tablet (1,000 mg total) by mouth 2 (two) times daily with a meal. 11/15/13  Yes Elvina Sidle, MD  sitaGLIPtin (JANUVIA) 50 MG tablet Take 1 tablet (50 mg total) by mouth daily. 11/15/13  Yes Elvina Sidle, MD  ondansetron (ZOFRAN ODT) 4 MG disintegrating tablet 4mg  ODT q4 hours prn nausea/vomit 04/10/14   Rolan Bucco, MD  sildenafil (VIAGRA) 100 MG tablet Take 1 tablet (100 mg total) by mouth daily as needed for erectile dysfunction. 11/15/13   Elvina Sidle, MD   BP 132/69 mmHg  Pulse 84  Temp(Src) 100.7 F (38.2 C) (Oral)  Resp 18  SpO2 98% Physical Exam  Constitutional: He is oriented to person, place, and time. He appears well-developed and well-nourished.  HENT:  Head: Normocephalic and atraumatic.  Mouth/Throat: Oropharynx is clear and moist.  Eyes: Pupils are equal, round, and reactive to light.  Neck: Normal range of motion. Neck supple.  Cardiovascular: Normal rate, regular rhythm and normal heart sounds.   Pulmonary/Chest: Effort normal and breath sounds normal. No respiratory distress. He has no wheezes. He has no rales. He exhibits no tenderness.  Abdominal: Soft. Bowel sounds are normal. There is no tenderness. There is no rebound and no guarding.  Musculoskeletal: Normal range of motion. He exhibits no edema.  Lymphadenopathy:    He has  no cervical adenopathy.  Neurological: He is alert and oriented to person, place, and time.  Skin: Skin is warm and dry. No rash noted.  Psychiatric: He has a normal mood and affect.    ED Course  Procedures (including critical care time) Labs Review Results for orders placed or performed during the hospital encounter of 04/10/14  Urinalysis, Routine w reflex microscopic  Result Value Ref Range   Color, Urine YELLOW YELLOW   APPearance CLEAR CLEAR   Specific Gravity, Urine 1.029 1.005 - 1.030   pH 5.5 5.0 - 8.0   Glucose, UA >1000 (A) NEGATIVE mg/dL   Hgb urine dipstick NEGATIVE NEGATIVE   Bilirubin Urine SMALL (A) NEGATIVE   Ketones, ur NEGATIVE NEGATIVE mg/dL   Protein, ur NEGATIVE NEGATIVE mg/dL   Urobilinogen, UA 1.0 0.0 - 1.0 mg/dL   Nitrite NEGATIVE NEGATIVE   Leukocytes, UA NEGATIVE NEGATIVE  Basic metabolic panel  Result Value Ref Range   Sodium 135 (L) 137 - 147 mEq/L   Potassium 4.1 3.7 - 5.3 mEq/L   Chloride 95 (L) 96 - 112 mEq/L   CO2 27 19 - 32 mEq/L   Glucose, Bld 267 (H) 70 - 99 mg/dL   BUN 17 6 - 23 mg/dL   Creatinine, Ser 2.441.05 0.50 - 1.35 mg/dL   Calcium 8.8 8.4 - 01.010.5 mg/dL   GFR calc non Af Amer 80 (L) >90 mL/min   GFR calc Af Amer >90 >90 mL/min   Anion gap 13 5 - 15  CBC with Differential  Result Value Ref Range   WBC 5.8 4.0 - 10.5 K/uL   RBC 4.32 4.22 - 5.81 MIL/uL   Hemoglobin 13.4 13.0 - 17.0 g/dL   HCT 27.238.4 (L) 53.639.0 - 64.452.0 %   MCV 88.9 78.0 - 100.0 fL   MCH 31.0 26.0 - 34.0 pg   MCHC 34.9 30.0 - 36.0 g/dL   RDW 03.412.6 74.211.5 - 59.515.5 %   Platelets 201 150 - 400 K/uL   Neutrophils Relative % 75 43 - 77 %   Neutro Abs 4.3 1.7 - 7.7 K/uL   Lymphocytes Relative 17 12 - 46 %   Lymphs Abs 1.0 0.7 - 4.0 K/uL   Monocytes Relative 5 3 - 12 %   Monocytes Absolute 0.3 0.1 - 1.0 K/uL   Eosinophils Relative 3 0 - 5 %   Eosinophils Absolute 0.2 0.0 - 0.7 K/uL   Basophils Relative 0 0 - 1 %   Basophils Absolute 0.0 0.0 - 0.1 K/uL  Urine microscopic-add  on  Result Value Ref Range   WBC, UA 0-2 <3 WBC/hpf   Casts HYALINE CASTS (A) NEGATIVE   Urine-Other MUCOUS PRESENT   CBG monitoring, ED  Result Value Ref Range   Glucose-Capillary 268 (H) 70 - 99 mg/dL   Dg Chest 2 View  63/87/564312/19/2015   CLINICAL DATA:  Cough for 3 days.  Fever.  EXAM: CHEST  2 VIEW  COMPARISON:  Radiograph 06/06/2012  FINDINGS: Normal cardiac silhouette. No effusion, infiltrate, pneumothorax. Degenerate spurring of the spine.  IMPRESSION: No acute cardiopulmonary process.  Electronically Signed   By: Genevive BiStewart  Edmunds M.D.   On: 04/10/2014 18:23      Imaging Review Dg Chest 2 View  04/10/2014   CLINICAL DATA:  Cough for 3 days.  Fever.  EXAM: CHEST  2 VIEW  COMPARISON:  Radiograph 06/06/2012  FINDINGS: Normal cardiac silhouette. No effusion, infiltrate, pneumothorax. Degenerate spurring of the spine.  IMPRESSION: No acute cardiopulmonary process.   Electronically Signed   By: Genevive BiStewart  Edmunds M.D.   On: 04/10/2014 18:23     EKG Interpretation None      MDM   Final diagnoses:  Viral syndrome    Patient was given dose of Tylenol and he's feeling much better. He was also given some Zofran. He's sitting up and feeling much better. He is nontoxic-appearing. The symptoms are most consistent with a viral infection. His chest x-rays negative for pneumonia. His urine is unremarkable. His abdominal exam is nontender. He was discharged home in good condition. He was instructed in symptomatic care. He was given prescription for Zofran to use for nausea. He was given return precautions.    Rolan BuccoMelanie Jake Fuhrmann, MD 04/10/14 2014

## 2014-04-10 NOTE — Discharge Instructions (Signed)

## 2014-08-11 ENCOUNTER — Encounter (HOSPITAL_COMMUNITY): Payer: Self-pay

## 2014-08-11 ENCOUNTER — Emergency Department (HOSPITAL_COMMUNITY)
Admission: EM | Admit: 2014-08-11 | Discharge: 2014-08-11 | Disposition: A | Discharge status: Home / Self Care | Attending: Emergency Medicine | Admitting: Emergency Medicine

## 2014-08-11 ENCOUNTER — Ambulatory Visit (INDEPENDENT_AMBULATORY_CARE_PROVIDER_SITE_OTHER): Admitting: Family Medicine

## 2014-08-11 VITALS — BP 146/82 | HR 77 | Temp 98.9°F | Resp 16 | Ht 70.5 in | Wt 213.0 lb

## 2014-08-11 DIAGNOSIS — I10 Essential (primary) hypertension: Secondary | ICD-10-CM | POA: Insufficient documentation

## 2014-08-11 DIAGNOSIS — E1110 Type 2 diabetes mellitus with ketoacidosis without coma: Secondary | ICD-10-CM

## 2014-08-11 DIAGNOSIS — T50905A Adverse effect of unspecified drugs, medicaments and biological substances, initial encounter: Secondary | ICD-10-CM

## 2014-08-11 DIAGNOSIS — E119 Type 2 diabetes mellitus without complications: Secondary | ICD-10-CM

## 2014-08-11 DIAGNOSIS — R350 Frequency of micturition: Secondary | ICD-10-CM

## 2014-08-11 DIAGNOSIS — Z79899 Other long term (current) drug therapy: Secondary | ICD-10-CM | POA: Insufficient documentation

## 2014-08-11 DIAGNOSIS — R682 Dry mouth, unspecified: Secondary | ICD-10-CM

## 2014-08-11 DIAGNOSIS — E1165 Type 2 diabetes mellitus with hyperglycemia: Secondary | ICD-10-CM | POA: Diagnosis not present

## 2014-08-11 DIAGNOSIS — G8929 Other chronic pain: Secondary | ICD-10-CM

## 2014-08-11 DIAGNOSIS — R739 Hyperglycemia, unspecified: Secondary | ICD-10-CM | POA: Diagnosis present

## 2014-08-11 DIAGNOSIS — T887XXA Unspecified adverse effect of drug or medicament, initial encounter: Secondary | ICD-10-CM | POA: Diagnosis not present

## 2014-08-11 DIAGNOSIS — E1310 Other specified diabetes mellitus with ketoacidosis without coma: Secondary | ICD-10-CM

## 2014-08-11 DIAGNOSIS — Z7982 Long term (current) use of aspirin: Secondary | ICD-10-CM | POA: Diagnosis not present

## 2014-08-11 DIAGNOSIS — M25561 Pain in right knee: Secondary | ICD-10-CM

## 2014-08-11 LAB — URINALYSIS, ROUTINE W REFLEX MICROSCOPIC
BILIRUBIN URINE: NEGATIVE
Glucose, UA: >1000 mg/dL — AB
Hgb urine dipstick: NEGATIVE
KETONES UR: 15 mg/dL — AB
LEUKOCYTES UA: NEGATIVE
NITRITE: NEGATIVE
PH: 6 (ref 5.0–8.0)
PROTEIN: NEGATIVE mg/dL
Specific Gravity, Urine: 1.031 — ABNORMAL HIGH (ref 1.005–1.030)
Urobilinogen, UA: 0.2 mg/dL (ref 0.0–1.0)

## 2014-08-11 LAB — POCT URINALYSIS DIPSTICK
BILIRUBIN UA: NEGATIVE
GLUCOSE UA: 500
Ketones, UA: 15
Leukocytes, UA: NEGATIVE
Nitrite, UA: NEGATIVE
Protein, UA: NEGATIVE
SPEC GRAV UA: 1.01
Urobilinogen, UA: 0.2
pH, UA: 5

## 2014-08-11 LAB — CBG MONITORING, ED
Glucose-Capillary: 392 mg/dL — ABNORMAL HIGH (ref 70–99)
Glucose-Capillary: 300 mg/dL — ABNORMAL HIGH (ref 70–99)

## 2014-08-11 LAB — CBC WITH DIFFERENTIAL/PLATELET
BASOS ABS: 0 10*3/uL (ref 0.0–0.1)
Basophils Relative: 0 % (ref 0–1)
EOS ABS: 0 10*3/uL (ref 0.0–0.7)
Eosinophils Relative: 0 % (ref 0–5)
HCT: 41.1 % (ref 39.0–52.0)
HEMOGLOBIN: 14.7 g/dL (ref 13.0–17.0)
Lymphocytes Relative: 18 % (ref 12–46)
Lymphs Abs: 1.7 10*3/uL (ref 0.7–4.0)
MCH: 30.8 pg (ref 26.0–34.0)
MCHC: 35.8 g/dL (ref 30.0–36.0)
MCV: 86.2 fL (ref 78.0–100.0)
MONOS PCT: 8 % (ref 3–12)
Monocytes Absolute: 0.8 10*3/uL (ref 0.1–1.0)
NEUTROS ABS: 6.6 10*3/uL (ref 1.7–7.7)
Neutrophils Relative %: 74 % (ref 43–77)
Platelets: 249 10*3/uL (ref 150–400)
RBC: 4.77 MIL/uL (ref 4.22–5.81)
RDW: 12.4 % (ref 11.5–15.5)
WBC: 9 10*3/uL (ref 4.0–10.5)

## 2014-08-11 LAB — COMPREHENSIVE METABOLIC PANEL
ALT: 14 U/L (ref 0–53)
AST: 25 U/L (ref 0–37)
Albumin: 4 g/dL (ref 3.5–5.2)
Alkaline Phosphatase: 33 U/L — ABNORMAL LOW (ref 39–117)
Anion gap: 12 (ref 5–15)
BUN: 29 mg/dL — ABNORMAL HIGH (ref 6–23)
CALCIUM: 9.3 mg/dL (ref 8.4–10.5)
CO2: 24 mmol/L (ref 19–32)
Chloride: 91 mmol/L — ABNORMAL LOW (ref 96–112)
Creatinine, Ser: 1.12 mg/dL (ref 0.50–1.35)
GFR calc non Af Amer: 73 mL/min — ABNORMAL LOW (ref >90)
GFR, EST AFRICAN AMERICAN: 85 mL/min — AB (ref >90)
GLUCOSE: 507 mg/dL — AB (ref 70–99)
Potassium: 5 mmol/L (ref 3.5–5.1)
SODIUM: 127 mmol/L — AB (ref 135–145)
TOTAL PROTEIN: 7.1 g/dL (ref 6.0–8.3)
Total Bilirubin: 1.9 mg/dL — ABNORMAL HIGH (ref 0.3–1.2)

## 2014-08-11 LAB — GLUCOSE, POCT (MANUAL RESULT ENTRY)

## 2014-08-11 LAB — URINE MICROSCOPIC-ADD ON

## 2014-08-11 LAB — POCT GLYCOSYLATED HEMOGLOBIN (HGB A1C): Hemoglobin A1C: 12.6

## 2014-08-11 MED ORDER — INSULIN ASPART 100 UNIT/ML ~~LOC~~ SOLN
10.0000 [IU] | Freq: Once | SUBCUTANEOUS | Status: AC
  Administered 2014-08-11: 10 [IU] via INTRAVENOUS
  Filled 2014-08-11: qty 1

## 2014-08-11 MED ORDER — SODIUM CHLORIDE 0.9 % IV BOLUS (SEPSIS)
1000.0000 mL | Freq: Once | INTRAVENOUS | Status: AC
  Administered 2014-08-11: 1000 mL via INTRAVENOUS

## 2014-08-11 MED ORDER — LISINOPRIL-HYDROCHLOROTHIAZIDE 20-12.5 MG PO TABS: 1.0000 | ORAL_TABLET | Freq: Every day | ORAL | Status: DC

## 2014-08-11 MED ORDER — GLYBURIDE 5 MG PO TABS: 10.0000 mg | ORAL_TABLET | Freq: Every day | ORAL | Status: DC

## 2014-08-11 MED ORDER — METFORMIN HCL 1000 MG PO TABS: 1000.0000 mg | ORAL_TABLET | Freq: Two times a day (BID) | ORAL | Status: DC

## 2014-08-11 NOTE — ED Notes (Signed)
CBG 394 

## 2014-08-11 NOTE — Patient Instructions (Addendum)
[facility] be [facility] for IV fluids and insulin therapy to get that sugar down acutely. They will decide whether he needs to be kept longer or not. It is mandatory that you get back on your diabetic medications and follow very frequently. You need to be monitoring your home glucose regularly and keeping record of this to show to the physicians each time.  If you're released from the hospital, plan to return here in 1-2 days for a recheck  May need to ultimately be referred to a diabetic specialist for management.

## 2014-08-11 NOTE — ED Notes (Signed)
Pt CBG, 300. Nurse was notified.

## 2014-08-11 NOTE — ED Notes (Signed)
Pt presents with report of hyperglycemia, [facility]  Pt reports receiving cortisone shot to R knee yesterday.

## 2014-08-11 NOTE — Discharge Instructions (Signed)
Your blood sugar can go up for many reasons - the steroid injection is one of the reasons - it may also rise if you are not eating a healthy diabetic diet - see attached informational sheet.  Please call your doctor for a followup appointment within 24-48 hours. When you talk to your doctor please let them know that you were seen in the emergency department and have them acquire all of your records so that they can discuss the findings with you and formulate a treatment plan to fully care for your new and ongoing problems.   Emergency Department Resource Guide 1) Find a Doctor and Pay Out of Pocket Although you won't have to find out who is covered by your insurance plan, it is a good idea to ask around and get recommendations. You will then need to call the office and see if the doctor you have chosen will accept you as a new patient and what types of options they offer for patients who are self-pay. Some doctors offer discounts or will set up payment plans for their patients who do not have insurance, but you will need to ask so you aren't surprised when you get to your appointment.  2) Contact Your Local Health Department Not all health departments have doctors that can see patients for sick visits, but many do, so it is worth a call to see if yours does. If you don't know where your local health department is, you can check in your phone book. The CDC also has a tool to help you locate your state's health department, and many state websites also have listings of all of their local health departments.  3) Find a Walk-in Clinic If your illness is not likely to be very severe or complicated, you may want to try a walk in clinic. These are popping up all over the country in pharmacies, drugstores, and shopping centers. They're usually staffed by nurse practitioners or physician assistants that have been trained to treat common illnesses and complaints. They're usually fairly quick and inexpensive.  However, if you have serious medical issues or chronic medical problems, these are probably not your best option.  No Primary Care Doctor: - Call Health Connect at  [phone] - they can help you locate a primary care doctor that  accepts your insurance, provides certain services, etc. - Physician Referral Service- [phone]  Chronic Pain Problems: Organization         Address  Phone   Notes  [facility]  [phone] Patients need to be referred by their primary care doctor.   Medication Assistance: Organization         Address  Phone   Notes  [facility] [address] [place], [place] [address] [phone] --Must be a resident of [facility] -- Must have NO insurance coverage whatsoever (no Medicaid/ Medicare, etc.) -- The pt. MUST have a primary care doctor that directs their care regularly and follows them in the community   [facility]  [phone]   [employer]  [phone]    Agencies that provide inexpensive medical care: Organization         Address  Phone   Notes  [facility]  [phone]   [facility]    [phone]   [facility] [address] [place], [place] [address] [phone]   [facility] [address] [place]  [place] [phone]   Planned Parenthood    [phone]) [phone]   [facility]    [phone]   [facility]  [address], [place] Phone:  [phone](336) [phone], Fax:  [phone](336) [phone] Hours of Operation:  9 am - 6 pm, M-F.  Also accepts Medicaid/Medicare and self-pay.  [facility]  [address], [place] Phone: [phone], Fax: [phone]. Hours of Operation:  8:30 am - 5:30 pm, M-F.  Also accepts Medicaid and self-pay.  [facility] [place] [address], [place] Phone: [phone](336) [phone]   [facility] [address], [place], [place] [phone], Ext. [phone] Mondays & Thursdays: 7-9 AM.  First 15 patients are seen on a first come, first serve basis.    Medicaid-accepting [facility] Providers:  Organization         Address  Phone   Notes  [facility] [address], [place] [phone] Also accepts self-pay patients.  [facility] [address], [place]  [phone](336) [phone]   [facility] [address], [place] [phone]) [phone]   [facility] [address] [place] [address], [place] [phone](336) [phone]   Renaye RakersVeita Bland [address], [place]   [phone] Only accepts [place] Access [place] patients after they have their name applied to their card.   Self-Pay (no insurance) in [facility]:  Organization         Address  Phone   Notes  Sickle Cell Patients, [facility] [address], [place] [phone]) [phone]   [facility] [address], [place] [phone]   [facility]  [address], [place] [phone]) [phone]   [facility]/Dr. Osei-Bonsu  [address] [place] [address], [place] or [phone], [place] [phone]) [phone] Phone number for both [place] and [place] locations is the same.  [facility] [address], [place] [phone]   [facility] [address] [place] [address], [place] or [address] [phone]) [phone]   [facility] [address], [place] [phone]) [phone], phone; [phone]) [phone], fax Sees patients 1st and 3rd Saturday of every month.  Must not qualify for public or private insurance (i.e. Medicaid, Medicare, [place] Health Choice, Veterans' Benefits)  Household income should be no more than 200% of the poverty level The clinic cannot treat you if you are pregnant or think  you are pregnant  Sexually transmitted diseases are not treated at the clinic.    Dental Care: Organization         Address  Phone  Notes  [facility] [address], [place] [phone]) [phone] Accepts children up to age 53 who are enrolled in [place] or [place] Health Choice; pregnant women with a Medicaid card; and children who have applied for Medicaid or [place] Health Choice, but were declined, whose parents can pay a reduced fee at time of service.  [facility] [facility] [place]  [address], [place] [phone]) [phone] Accepts children up to age 53 who are enrolled in [place] or [place] Health Choice; pregnant women with a Medicaid card; and children who have applied for Medicaid or [place]  Health Choice, but were declined, whose parents can pay a reduced fee at time of service.  [facility]  [address] [place], [place] [phone] Patients are seen by appointment only. Walk-ins are not accepted. [facility] will see patients 40 years of age and older. Monday - Tuesday (8am-5pm) Most Wednesdays (8:30-5pm) $30 per visit, cash only  [facility] Adult Dental Access PROGRAM  [address], [facility] [phone] Patients are seen by appointment only. Walk-ins are not accepted. [facility] will see patients 33 years of age and older. One Wednesday Evening (Monthly: Volunteer Based).  $30 per visit, cash only  [employer]  [phone] for adults; Children under age 57, call Graduate Pediatric Dentistry at [phone]. Children aged 16-14, please call [phone] to request a pediatric application.  Dental services are provided in all areas of dental care including fillings, crowns and bridges, complete and partial dentures, implants, gum treatment, root canals, and extractions. Preventive care is also provided. Treatment is provided to both adults and  children. Patients are selected via a lottery and there is often a waiting list.   [facility] [address], [place]  [phone] [email or web address]   [facility] [address] [place], [place] [phone], Ext. [phone] Second and Fourth Thursday of each month, opens at 6:30 AM; Clinic ends at 9 AM.  Patients are seen on a first-come first-served basis, and a limited number are seen during each clinic.   [facility]  [address] [place], [place] [phone]   Eligibility Requirements You must have lived in [place], [place], or [place] counties for at least the last three months.   You cannot be eligible for state or federal sponsored [place], including [employer], [place], or [employer].   You generally cannot be eligible for healthcare insurance through your employer.    How to apply: Eligibility screenings are held every Tuesday and Wednesday afternoon from 1:00 pm until 4:00 pm. You do not need an appointment for the interview!  [facility] [address] [place], [place] [phone]   [facility] Health Department  [phone]   [facility] Health Department  [phone]   [facility]  [phone]    Behavioral Health Resources in the Community: Intensive Outpatient Programs Organization         Address  Phone  Notes  [facility] [address] [place], [place] [phone]   [facility] [place] [facility] [address] [place], [place] [phone]   [facility]: [facility] [address] [place], [place]  [phone]   [facility] Mental Health [address]  [place], [place] [phone] or [phone]   Substance Abuse Resources Organization         Address  Phone  Notes  Al[facility] Drug Services  [phone]   [facility]  [phone]   [facility] [place]  [phone]     Floydene Flock  [phone]   Residential & Outpatient Substance Abuse Program  [phone]   Psychological Services Organization         Address  Phone  Notes  [facility]  [phone]   [facility]  [phone]   [facility] Mental Health [address], [place] [phone] or [phone]    Mobile Crisis Teams Organization         Address  Phone  Notes  [facility], Mobile Crisis Care Unit  [phone]   [facility]  [address] [place], [place]   [facility] [address], [place] [phone]    Self-Help/Support Groups Organization         Address  Phone             Notes  [facility] [place] - variety of support groups  [place] Call for more information  Narcotics Anonymous (NA), [facility] [address], [employer] [place]  2 meetings at this location   Special educational needs teacher         Address  Phone  Notes  [facility] [place],    [place]  [phone]   [facility]  [address], [place] [phone], [place], [place]   [place], [place] [phone] Admissions: 8am-3pm M-F  Incentives Substance [place] [address],    [place], [place] [phone]   [facility] [address] [place], [place], [place]   [facility] [address],  [place], [place]   [facility] - Intensive Outpatient [address], [place], [place]   [facility] ([place] [place].,  [place], [place] [phone] or [phone]   [facility] ([facility]) [address], [place], [place] Accepts Medicaid  [facility] [place] [address]  [place] [phone] Substance Abuse/Addiction Treatment   [facility] Organization         Address  Phone  Notes  [facility]  [phone]   Domenic Schwab, PhD [address] [place], [place]   [phone] or [phone]   [place], [place] [phone]   [facility] [address] [place], [place] [phone] Insurance/Medicaid/sponsorship through [facility] and Families [address], [address]                                    [place], [place] [phone] [place] [address] [place], [place] [phone]    Dr. Adele Schilder  [phone]   [facility] 1) [address] [place] 2) [place] 3)  [place] [address], [place] [phone]  [phone]   [place] [phone] or [phone] (After Hours)

## 2014-08-11 NOTE — ED Notes (Signed)
CBG 392. Patient stated he ate a plate of food while waiting in the lobby to be brought back to room.

## 2014-08-11 NOTE — Progress Notes (Signed)
Subjective: 54 year old man who was here several months ago with a knee injury. He was referred to orthopedics. Dr. Turner Danielsowan gave him a cortisone shot in his right knee yesterday. Apparently had urinary frequency since that time and a sugar of around 270 last night. He has a history of diabetes, has not been back for a recheck of the diabetes since last summer. He apparently [facility] and was told his A1c was 9.3, but the last one we have was quite high. He says his sugars usually run in the low 100s. 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 His complaints of dry mouth and frequent urination lead to the post office anymore back over here as part of his workers comp as a palpitation of his therapy.  Objective: Generally healthy-appearing man. Right knee is not swollen but it is still painful. His throat is clear with mucous membranes moist. Neck supple without nodes. Chest is clear to auscultation. Heart regular without murmurs. Abdomen soft and nontender.  Assessment: History of knee injury, workers comp Diabetes mellitus, exacerbated by a side effect of cortisone injection in his knee Dry mouth Urinary frequency  Plan: Check labs  Results for orders placed or performed in visit on 08/11/14  POCT glucose (manual entry)  Result Value Ref Range   POC Glucose >444 70 - 99 mg/dl  POCT glycosylated hemoglobin (Hb A1C)  Result Value Ref Range   Hemoglobin A1C 12.6    Results for orders placed or performed in visit on 08/11/14  POCT glucose (manual entry)  Result Value Ref Range   POC Glucose >444 70 - 99 mg/dl  POCT glycosylated hemoglobin (Hb A1C)  Result Value Ref Range   Hemoglobin A1C 12.6   POCT urinalysis dipstick  Result Value Ref Range   Color, UA light yellow    Clarity, UA clear    Glucose, UA 500    Bilirubin, UA neg    Ketones, UA 15    Spec Grav, UA 1.010    Blood, UA tr-intact    pH, UA 5.0    Protein, UA neg     Urobilinogen, UA 0.2    Nitrite, UA neg    Leukocytes, UA Negative    Poorly controlled diabetes, exacerbated by cortisone shot yesterday, with probable early DKA. Send patient on over to the emergency room where he needs to get IV hydration and insulin. We do not have any short-acting insulin to give him here today. Patient is alert and oriented and ambulatory and drove himself here fine. He has been walking up and down the halls. I believe he is safe to drive himself to the emergency room. He was instructed that he is to go directly there.  Stressed to him the need to come back here as soon as he gets out of the hospital in order that he can be corrected did on his diabetic medications and may need a referral to a diabetic specialist.  Form completed for the workers compensation complement form

## 2014-08-11 NOTE — ED Notes (Signed)
Family at bedside. 

## 2014-08-11 NOTE — ED Provider Notes (Signed)
CSN: [id number]     Arrival date & time 08/11/14  1404 History   First MD Initiated Contact with Patient 08/11/14 1822     No chief complaint on file.    (Consider location/radiation/quality/duration/timing/severity/associated sxs/prior Treatment) HPI Comments: The patient is a 54 year old diabetic male, type 2 diabetes on metformin and lisinopril and glyburide. He presents to the hospital with a complaint of high blood sugar, he went to the urgent care because he had been having polyuria and polydipsia, found to have a blood sugar over 400 and sent to the hospital. He reports having chronic knee pain on the right, he received his second cortisone shot yesterday, had some agitation yesterday it has relieved today. He denies fevers chills nausea vomiting coughing shortness of breath chest pain back pain abdominal pain diarrhea or dysuria but does endorse frequent urination. He has been taking his diabetic medications as prescribed, he has not checked his blood sugar in 2 weeks until today  The history is provided by the patient.    Past Medical History  Diagnosis Date  . Hypertension   . Diabetes mellitus   . Seasonal allergies    Past Surgical History  Procedure Laterality Date  . Right leg fracture    . Hernia repair     Family History  Problem Relation Age of Onset  . Heart disease Mother   . Heart disease Father   . Diabetes Father   . Heart failure Mother   . Heart failure Father    History  Substance Use Topics  . Smoking status: Never Smoker   . Smokeless tobacco: Not on file  . Alcohol Use: No    Review of Systems  All other systems reviewed and are negative.     Allergies  Benadryl  Home Medications   Prior to Admission medications   Medication Sig Start Date End Date Taking? Authorizing Provider  aspirin 81 MG chewable tablet Chew 81 mg by mouth daily.   Yes Historical Provider, MD  glyBURIDE (DIABETA) 5 MG tablet Take 2 tablets (10 mg total) by mouth  daily with breakfast. 08/11/14  Yes Peyton Najjar, MD  lisinopril-hydrochlorothiazide (PRINZIDE,ZESTORETIC) 20-12.5 MG per tablet Take 1 tablet by mouth daily. 08/11/14  Yes Peyton Najjar, MD  metFORMIN (GLUCOPHAGE) 1000 MG tablet Take 1 tablet (1,000 mg total) by mouth 2 (two) times daily with a meal. 08/11/14  Yes Peyton Najjar, MD  sildenafil (VIAGRA) 100 MG tablet Take 1 tablet (100 mg total) by mouth daily as needed for erectile dysfunction. 11/15/13  Yes Elvina Sidle, MD  ondansetron (ZOFRAN ODT) 4 MG disintegrating tablet  ODT q4 hours prn nausea/vomit Patient not taking: Reported on 08/11/2014 04/10/14   Rolan Bucco, MD  sitaGLIPtin (JANUVIA) 50 MG tablet Take 1 tablet (50 mg total) by mouth daily. Patient not taking: Reported on 08/11/2014 11/15/13   Elvina Sidle, MD   BP 132/80 mmHg  Pulse 75  Temp(Src) 98.6 F (37 C) (Oral)  Resp 18  Ht  (1.778 m)  Wt 210 lb (95.255 kg)  BMI 30.13 kg/m2  SpO2 100% Physical Exam  Constitutional: He appears well-developed and well-nourished. No distress.  HENT:  Head: Normocephalic and atraumatic.  Mouth/Throat: Oropharynx is clear and moist. No oropharyngeal exudate.  Eyes: Conjunctivae and EOM are normal. Pupils are equal, round, and reactive to light. Right eye exhibits no discharge. Left eye exhibits no discharge. No scleral icterus.  Neck: Normal range of motion. Neck supple. No JVD present. No  thyromegaly present.  Cardiovascular: Normal rate, regular rhythm, normal heart sounds and intact distal pulses.  Exam reveals no gallop and no friction rub.   No murmur heard. Pulmonary/Chest: Effort normal and breath sounds normal. No respiratory distress. He has no wheezes. He has no rales.  Abdominal: Soft. Bowel sounds are normal. He exhibits no distension and no mass. There is no tenderness.  Musculoskeletal: Normal range of motion. He exhibits no edema or tenderness.  Right knee normal-appearing, no obvious effusions, no redness,  no swelling, joint is supple, minimal decreased range of motion  Lymphadenopathy:    He has no cervical adenopathy.  Neurological: He is alert. Coordination normal.  Skin: Skin is warm and dry. No rash noted. No erythema.  Psychiatric: He has a normal mood and affect. His behavior is normal.  Nursing note and vitals reviewed.   ED Course  Procedures (including critical care time) Labs Review Labs Reviewed  COMPREHENSIVE METABOLIC PANEL - Abnormal; Notable for the following:    Sodium 127 (*)    Chloride 91 (*)    Glucose, Bld 507 (*)    BUN 29 (*)    Alkaline Phosphatase 33 (*)    Total Bilirubin 1.9 (*)    GFR calc non Af Amer 73 (*)    GFR calc Af Amer 85 (*)    All other components within normal limits  URINALYSIS, ROUTINE W REFLEX MICROSCOPIC - Abnormal; Notable for the following:    Specific Gravity, Urine 1.031 (*)    Glucose, UA >1000 (*)    Ketones, ur 15 (*)    All other components within normal limits  CBG MONITORING, ED - Abnormal; Notable for the following:    Glucose-Capillary 392 (*)    All other components within normal limits  CBG MONITORING, ED - Abnormal; Notable for the following:    Glucose-Capillary 300 (*)    All other components within normal limits  CBC WITH DIFFERENTIAL/PLATELET  URINE MICROSCOPIC-ADD ON    Imaging Review No results found.   MDM   Final diagnoses:  Hyperglycemia due to type 2 diabetes mellitus    The patient is well-appearing, vital signs are normal, joint is supple, doubt septic arthritis, possibly steroid related hyperglycemia though this could've been going on for several weeks. We'll check electrolytes, rule out DKA, give IV fluid bolus, insulin as needed, check electrolytes.  Sugar has gradually decreased with insulin and fluids - pt is asymptomatic, has no AG acidosis - stable for d/c. Information on appropriate diet given.  Understanding expressed.  Meds given in ED:  Medications  sodium chloride 0.9 % bolus  1,000 mL (0 mLs Intravenous Stopped 08/11/14 2000)  sodium chloride 0.9 % bolus 1,000 mL (0 mLs Intravenous Stopped 08/11/14 2151)  insulin aspart (novoLOG) injection 10 Units (10 Units Intravenous Given 08/11/14 2003)  insulin aspart (novoLOG) injection 10 Units (10 Units Intravenous Given 08/11/14 2151)      Eber HongBrian Jareb Radoncic, MD 08/11/14 2306

## 2014-08-12 LAB — CBG MONITORING, ED: Glucose-Capillary: 394 mg/dL — ABNORMAL HIGH (ref 70–99)

## 2014-08-13 ENCOUNTER — Telehealth: Payer: Self-pay | Admitting: *Deleted

## 2014-08-16 ENCOUNTER — Ambulatory Visit (INDEPENDENT_AMBULATORY_CARE_PROVIDER_SITE_OTHER): Admitting: Family Medicine

## 2014-08-16 VITALS — BP 128/74 | HR 82 | Temp 98.0°F | Resp 16 | Ht 70.0 in | Wt 210.2 lb

## 2014-08-16 DIAGNOSIS — M25561 Pain in right knee: Secondary | ICD-10-CM

## 2014-08-16 DIAGNOSIS — R739 Hyperglycemia, unspecified: Secondary | ICD-10-CM

## 2014-08-16 DIAGNOSIS — G8929 Other chronic pain: Secondary | ICD-10-CM | POA: Diagnosis not present

## 2014-08-16 LAB — GLUCOSE, POCT (MANUAL RESULT ENTRY): POC Glucose: 369 mg/dl — AB (ref 70–99)

## 2014-08-16 MED ORDER — GLIPIZIDE 5 MG PO TABS: 5.0000 mg | ORAL_TABLET | Freq: Two times a day (BID) | ORAL | Status: DC

## 2014-08-16 MED ORDER — CANAGLIFLOZIN 100 MG PO TABS: 100.0000 mg | ORAL_TABLET | Freq: Every day | ORAL | Status: DC

## 2014-08-16 MED ORDER — BLOOD GLUCOSE TEST VI STRP: 1.0000 "application " | ORAL_STRIP | Freq: Three times a day (TID) | Status: DC | PRN

## 2014-08-16 NOTE — Progress Notes (Addendum)
Patient ID: Zachary Walsh, male   DOB: 08/27/1960, 54 y.o.   MRN: [id number]  This chart was scribed for Elvina SidleKurt Shameria Trimarco, MD by Charline BillsEssence Howell, ED Scribe. The patient was seen in room 13. Patient's care was started at 11:44 AM.  Patient ID: Zachary Walsh MRN: [id number], DOB: 08/27/1960, 54 y.o. Date of Encounter: 08/16/2014, 11:44 AM  Primary Physician: No PCP Per Patient  Chief Complaint  Patient presents with  . Follow-up    workers comp/ 4/20 ed visit diabetic/ med reaction    HPI: 54 y.o. year old male with history below presents for a workers comp follow-up. Pt was seen in the ED on 08/11/14 for elevated blood glucose following a cortisone injection on 08/10/14 by orthopedist Gean BirchwoodFrank Rowan. Pt states that the injections did not provide relief; he still reports intermittent pain to the outside of his R knee. Pt's blood glucose was 500 when checked 5 days ago. He does not check his blood glucose at home. He has an One Touch monitor but needs a prescription for test strips.   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   Past Medical History  Diagnosis Date  . Hypertension   . Diabetes mellitus   . Seasonal allergies      Home Meds: Prior to Admission medications   Medication Sig Start Date End Date Taking? Authorizing Provider  aspirin 81 MG chewable tablet Chew 81 mg by mouth daily.   Yes Historical Provider, MD  glyBURIDE (DIABETA) 5 MG tablet Take 2 tablets (10 mg total) by mouth daily with breakfast. 08/11/14  Yes Peyton Najjaravid H Hopper, MD  lisinopril-hydrochlorothiazide (PRINZIDE,ZESTORETIC) 20-12.5 MG per tablet Take 1 tablet by mouth daily. 08/11/14  Yes Peyton Najjaravid H Hopper, MD  sildenafil (VIAGRA) 100 MG tablet Take 1 tablet (100 mg total) by mouth daily as needed for erectile dysfunction. 11/15/13  Yes Elvina SidleKurt Doreather Hoxworth, MD  metFORMIN (GLUCOPHAGE) 1000 MG tablet Take 1 tablet (1,000 mg total) by mouth 2 (two) times daily with a meal. 08/11/14   Peyton Najjaravid H Hopper, MD     Allergies:  Allergies  Allergen Reactions  . Benadryl [Diphenhydramine Hcl] Anxiety    History   Social History  . Marital Status: Married    Spouse Name: N/A  . Number of Children: N/A  . Years of Education: N/A   Occupational History  . Not on file.   Social History Main Topics  . Smoking status: Never Smoker   . Smokeless tobacco: Not on file  . Alcohol Use: No  . Drug Use: No  . Sexual Activity: Not on file   Other Topics Concern  . Not on file   Social History Narrative     Review of Systems: Constitutional: negative for chills, fever, night sweats, weight changes, or fatigue  HEENT: negative for vision changes, hearing loss, congestion, rhinorrhea, ST, epistaxis, or sinus pressure Cardiovascular: negative for chest pain or palpitations Respiratory: negative for hemoptysis, wheezing, shortness of breath, or cough Abdominal: negative for abdominal pain, nausea, vomiting, diarrhea, or constipation Msk: + arthralgias  Dermatological: negative for rash Neurologic: negative for headache, dizziness, or syncope All other systems reviewed and are otherwise negative with the exception to those above and in the HPI.  Physical Exam: Blood pressure 128/74, pulse 82, temperature 98 F (36.7 C), temperature source Oral, resp. rate 16, height 5\' 10"  (1.778 m), weight 210 lb 3.2 oz (95.346 kg), SpO2 98 %., Body mass index is 30.16 kg/(m^2). General: Well developed, well nourished,  in no acute distress. Head: Normocephalic, atraumatic, eyes without discharge, sclera non-icteric, nares are without discharge. Bilateral auditory canals clear, TM's are without perforation, pearly grey and translucent with reflective cone of light bilaterally. Oral cavity moist, posterior pharynx without exudate, erythema, peritonsillar abscess, or post nasal drip.  Neck: Supple. No thyromegaly. Full ROM. No lymphadenopathy. Lungs: Clear bilaterally to auscultation without wheezes, rales, or  rhonchi. Breathing is unlabored. Heart: RRR with S1 S2. No murmurs, rubs, or gallops appreciated. Abdomen: Soft, non-tender, non-distended with normoactive bowel sounds. No hepatomegaly. No rebound/guarding. No obvious abdominal masses. Msk:  Strength and tone normal for age. R knee: normal appearance. Good ROM. No crepitus. No ligamentous laxity.  Extremities/Skin: Warm and dry. No clubbing or cyanosis. No edema. No rashes or suspicious lesions. Neuro: Alert and oriented X 3. Moves all extremities spontaneously. Gait is normal. CNII-XII grossly in tact. Psych:  Responds to questions appropriately with a normal affect.   Labs: Results for orders placed or performed in visit on 08/16/14  POCT glucose (manual entry)  Result Value Ref Range   POC Glucose 369 (A) 70 - 99 mg/dl     ASSESSMENT AND PLAN:  54 y.o. year old male with  1. Knee pain, chronic, right   2. Hyperglycemia    This chart was scribed in my presence and reviewed by me personally.    ICD-9-CM ICD-10-CM   1. Knee pain, chronic, right 719.46 M25.561    338.29 G89.29   2. Hyperglycemia 790.29 R73.9 POCT glucose (manual entry)     Ambulatory referral to Endocrinology     canagliflozin (INVOKANA) 100 MG TABS tablet     glipiZIDE (GLUCOTROL) 5 MG tablet     Glucose Blood (BLOOD GLUCOSE TEST STRIPS) STRP     Signed, Elvina Sidle, MD   Signed, Elvina Sidle, MD 08/16/2014 11:44 AM

## 2014-08-16 NOTE — Patient Instructions (Signed)
In one week to seal the sugars coming down. Expect to have increased volume of urine per day or 2.  I refer you to an endocrinologist area of also called in some test strips.

## 2014-10-27 NOTE — Telephone Encounter (Signed)
Error

## 2014-11-10 ENCOUNTER — Ambulatory Visit

## 2014-11-10 ENCOUNTER — Ambulatory Visit (INDEPENDENT_AMBULATORY_CARE_PROVIDER_SITE_OTHER): Admitting: Family Medicine

## 2014-11-10 VITALS — BP 126/70 | HR 92 | Temp 98.7°F | Resp 16 | Ht 70.0 in | Wt 222.0 lb

## 2014-11-10 DIAGNOSIS — M25552 Pain in left hip: Secondary | ICD-10-CM

## 2014-11-10 DIAGNOSIS — M25561 Pain in right knee: Secondary | ICD-10-CM | POA: Diagnosis not present

## 2014-11-10 DIAGNOSIS — M545 Low back pain, unspecified: Secondary | ICD-10-CM

## 2014-11-10 DIAGNOSIS — G8929 Other chronic pain: Secondary | ICD-10-CM

## 2014-11-10 DIAGNOSIS — M25551 Pain in right hip: Secondary | ICD-10-CM | POA: Diagnosis not present

## 2014-11-10 DIAGNOSIS — T148 Other injury of unspecified body region: Secondary | ICD-10-CM | POA: Diagnosis not present

## 2014-11-10 DIAGNOSIS — T148XXA Other injury of unspecified body region, initial encounter: Secondary | ICD-10-CM

## 2014-11-10 MED ORDER — CYCLOBENZAPRINE HCL 5 MG PO TABS: 5.0000 mg | ORAL_TABLET | Freq: Every evening | ORAL | Status: DC | PRN

## 2014-11-10 MED ORDER — HYDROCODONE-ACETAMINOPHEN 5-325 MG PO TABS: 1.0000 | ORAL_TABLET | Freq: Three times a day (TID) | ORAL | Status: DC | PRN

## 2014-11-10 MED ORDER — MELOXICAM 7.5 MG PO TABS: 7.5000 mg | ORAL_TABLET | Freq: Every day | ORAL | Status: DC

## 2014-11-10 NOTE — Progress Notes (Signed)
Chief Complaint:  Chief Complaint  Patient presents with  . Hip Pain    x 1 week,  Workers Comp  . Back Pain    HPI: Zachary Walsh is a 54 y.o. male who reports to [facility] today complaining of:  1 week history of bialteral hip pain, he also had back pain worse with certaim movements, It was so bad at work his supervisor told him to be seen, He is already on restricted duties for knee issues, he has arthritic chondromalacia. 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    Past Surgical History  Procedure Laterality Date  . Right leg fracture    . Hernia repair       Allergies  Allergen Reactions  . Benadryl [Diphenhydramine Hcl] Anxiety   Prior to Admission medications   Medication Sig Start Date End Date Taking? Authorizing Provider  aspirin 81 MG chewable tablet Chew 81 mg by mouth daily.   Yes Historical Provider, MD  canagliflozin (INVOKANA) 100 MG TABS tablet Take 1 tablet (100 mg total) by mouth daily. 08/16/14  Yes Elvina Sidle, MD  glipiZIDE (GLUCOTROL) 5 MG tablet Take 1 tablet (5 mg total) by mouth 2 (two) times daily before a meal. 08/16/14  Yes Elvina Sidle, MD  Glucose Blood (BLOOD GLUCOSE TEST STRIPS) STRP 1 application by In Vitro route 3 (three) times daily as needed. 08/16/14  Yes Elvina Sidle, MD  metFORMIN (GLUCOPHAGE) 1000 MG tablet Take 1 tablet (1,000 mg total) by mouth 2 (two) times daily with a meal. 08/11/14  Yes Peyton Najjar, MD  lisinopril-hydrochlorothiazide (PRINZIDE,ZESTORETIC) 20-12.5 MG per tablet Take 1 tablet by mouth daily. Patient not taking: Reported on 11/10/2014 08/11/14   Peyton Najjar, MD  sildenafil (VIAGRA) 100 MG tablet Take 1 tablet (100 mg total) by mouth daily as needed for erectile dysfunction. Patient not taking: Reported on 11/10/2014  11/15/13   Elvina Sidle, MD     ROS: The patient denies fevers, chills, night sweats, unintentional weight loss, chest pain, palpitations, wheezing, dyspnea on exertion, nausea, vomiting, abdominal pain, dysuria, hematuria, melena, numbness, weakness, or tingling.   All other systems have been reviewed and were otherwise negative with the exception of those mentioned in the HPI and as above.    PHYSICAL EXAM: Filed Vitals:   11/10/14 1311  BP: 126/70  Pulse: 92  Temp: 98.7 F (37.1 C)  Resp: 16   Body mass index is 31.85 kg/(m^2).   General: Alert, no acute distress HEENT:  Normocephalic, atraumatic, oropharynx patent. EOMI, PERRLA Cardiovascular:  Regular rate and rhythm, no rubs murmurs or gallops.  No Carotid bruits, radial pulse intact. No pedal edema.  Respiratory: Clear to auscultation bilaterally.  No wheezes, rales, or rhonchi.  No cyanosis, no use of accessory musculature Abdominal: No organomegaly, abdomen is soft and non-tender, positive bowel sounds. No masses. Skin: No rashes. Neurologic: Facial musculature symmetric. Psychiatric: Patient acts appropriately throughout our interaction. Lymphatic: No cervical or submandibular lymphadenopathy Musculoskeletal: Gait intact. No edema, tenderness + paramsk tenderness  Full ROM 5/5 strength, 2/2 DTRs No saddle anesthesia Straight leg negative Hip exam normal      LABS: Results for orders placed or performed in visit on 08/16/14  POCT glucose (manual entry)  Result Value Ref Range   POC  Glucose 369 (A) 70 - 99 mg/dl     EKG/XRAY:   [facility] Neg for fracture or dislocation   ASSESSMENT/PLAN: Encounter Diagnoses  Name Primary?  . Knee pain, chronic, right Yes  . Right-sided low back pain without sciatica   . Bilateral hip pain    New onset low back paina nd hip pain possibly related to knee pain He is being followed by Dr Turner Danielsowan for his knee for work comp so may  consider referring back if no improvement He is getting knee injectiosn with what sounds like Kneevis Rx mobic, flexeril and norco prn  For now will refer him back to PT where he goes for knee so he can get it all done at the same time  Gross sideeffects, risk and benefits, and alternatives of medications d/w patient. Patient is aware that all medications have potential sideeffects and we are unable to predict every sideeffect or drug-drug interaction that may occur.  Caniyah Murley DO  11/10/2014 2:25 PM

## 2014-11-19 ENCOUNTER — Other Ambulatory Visit: Payer: Self-pay | Admitting: Family Medicine

## 2014-12-03 ENCOUNTER — Encounter: Payer: Self-pay | Admitting: Endocrinology

## 2014-12-03 ENCOUNTER — Ambulatory Visit (INDEPENDENT_AMBULATORY_CARE_PROVIDER_SITE_OTHER): Payer: 59 | Admitting: Endocrinology

## 2014-12-03 ENCOUNTER — Other Ambulatory Visit: Payer: Self-pay | Admitting: Endocrinology

## 2014-12-03 VITALS — BP 136/88 | HR 78 | Temp 98.0°F | Ht 70.0 in | Wt 216.0 lb

## 2014-12-03 DIAGNOSIS — E871 Hypo-osmolality and hyponatremia: Secondary | ICD-10-CM | POA: Diagnosis not present

## 2014-12-03 DIAGNOSIS — R9431 Abnormal electrocardiogram [ECG] [EKG]: Secondary | ICD-10-CM | POA: Diagnosis not present

## 2014-12-03 DIAGNOSIS — E1165 Type 2 diabetes mellitus with hyperglycemia: Secondary | ICD-10-CM

## 2014-12-03 LAB — BASIC METABOLIC PANEL
BUN: 9 mg/dL (ref 7–25)
CO2: 28 mmol/L (ref 20–31)
Calcium: 10 mg/dL (ref 8.6–10.3)
Chloride: 100 mmol/L (ref 98–110)
Creat: 0.89 mg/dL (ref 0.70–1.33)
Glucose, Bld: 169 mg/dL — ABNORMAL HIGH (ref 65–99)
Potassium: 4.4 mmol/L (ref 3.5–5.3)
Sodium: 139 mmol/L (ref 135–146)

## 2014-12-03 LAB — POCT GLYCOSYLATED HEMOGLOBIN (HGB A1C): Hemoglobin A1C: 9.5

## 2014-12-03 NOTE — Progress Notes (Signed)
Subjective:    Patient ID: Zachary Walsh, male    DOB: Sep 06, 1960, 54 y.o.   MRN: [id number]  HPI pt states DM was dx'ed in 2004; he has mild neuropathy of the lower extremities; he is unaware of any associated chronic complications; he has never been on insulin; pt says his diet is good, but exercise is limited by knee pain; he has never had pancreatitis, severe hypoglycemia or DKA.  In early 2016, he was seen in ER for severe hyperglycemia after a steroid injection.  He says cbg's vary from 137-243.   Past Medical History  Diagnosis Date  . Hypertension   . Diabetes mellitus   . Seasonal allergies     Past Surgical History  Procedure Laterality Date  . Right leg fracture    . Hernia repair      Social History   Social History  . Marital Status: Married    Spouse Name: N/A  . Number of Children: N/A  . Years of Education: N/A   Occupational History  . Not on file.   Social History Main Topics  . Smoking status: Never Smoker   . Smokeless tobacco: Not on file  . Alcohol Use: No  . Drug Use: No  . Sexual Activity: Not on file   Other Topics Concern  . Not on file   Social History Narrative    Current Outpatient Prescriptions on File Prior to Visit  Medication Sig Dispense Refill  . aspirin 81 MG chewable tablet Chew 81 mg by mouth daily.    . canagliflozin (INVOKANA) 100 MG TABS tablet Take 1 tablet (100 mg total) by mouth daily. 90 tablet 3  . cyclobenzaprine (FLEXERIL) 5 MG tablet Take 1 tablet (5 mg total) by mouth at bedtime as needed for muscle spasms. 30 tablet 0  . glipiZIDE (GLUCOTROL) 5 MG tablet Take 1 tablet (5 mg total) by mouth 2 (two) times daily before a meal. 60 tablet 3  . Glucose Blood (BLOOD GLUCOSE TEST STRIPS) STRP 1 application by In Vitro route 3 (three) times daily as needed. (Patient taking differently: 1 application by In Vitro route 3 (three) times daily as needed. One touch verio) 100 each 3  . HYDROcodone-acetaminophen (NORCO) 5-325 MG  per tablet Take 1 tablet by mouth every 8 (eight) hours as needed for severe pain. Do not take extra tylenol with this 30 tablet 0  . lisinopril-hydrochlorothiazide (PRINZIDE,ZESTORETIC) 20-12.5 MG per tablet Take 1 tablet by mouth daily. PATIENT NEEDS BLOOD PRESSURE CHECK UP FOR ADDITIONAL REFILLS 30 tablet 0  . meloxicam (MOBIC) 7.5 MG tablet Take 1 tablet (7.5 mg total) by mouth daily. No other NSAID, take with food 14 tablet 0  . metFORMIN (GLUCOPHAGE) 1000 MG tablet Take 1 tablet by mouth twice daily with meals. PATIENT NEEDS DIABETES CHECK UP FOR ADDITIONAL REFILLS 60 tablet 0  . sildenafil (VIAGRA) 100 MG tablet Take 1 tablet (100 mg total) by mouth daily as needed for erectile dysfunction. 10 tablet 11   No current facility-administered medications on file prior to visit.    Allergies  Allergen Reactions  . Benadryl [Diphenhydramine Hcl] Anxiety    Family History  Problem Relation Age of Onset  . Heart disease Mother   . Heart disease Father   . Diabetes Father   . Heart failure Mother   . Heart failure Father     BP 136/88 mmHg  Pulse 78  Temp(Src) 98 F (36.7 C) (Oral)  Ht 5\' 10"  (1.778 m)  Wt 216 lb (97.977 kg)  BMI 30.99 kg/m2  SpO2 96%  Review of Systems denies weight loss, blurry vision, headache, chest pain, sob, n/v, urinary frequency, muscle cramps, excessive diaphoresis, cold intolerance, rhinorrhea, and easy bruising.  He has mild depression, and slight numbness of the feet.    Objective:   Physical Exam VS: see vs page GEN: no distress HEAD: head: no deformity eyes: no periorbital swelling, no proptosis external nose and ears are normal mouth: no lesion seen NECK: supple, thyroid is not enlarged CHEST WALL: no deformity LUNGS: clear to auscultation. BREASTS:  No gynecomastia CV: reg rate and rhythm; soft systolic murmur ABD: abdomen is soft, nontender.  no hepatosplenomegaly.  not distended.  no hernia MUSCULOSKELETAL: muscle bulk and strength are  grossly normal.  no obvious joint swelling.  gait is normal and steady EXTEMITIES: no deformity.  no ulcer on the feet.  feet are of normal color and temp.  no edema PULSES: dorsalis pedis intact bilat.  no carotid bruit.   NEURO:  cn 2-12 grossly intact.   readily moves all 4's.  sensation is intact to touch on the feet SKIN:  Normal texture and temperature.  No rash or suspicious lesion is visible.   NODES:  None palpable at the neck.   PSYCH: alert, well-oriented.  Does not appear anxious nor depressed.   A1c=9.5%  i personally reviewed electrocardiogram tracing: ? old AMI     Assessment & Plan:  DM: severe exacerbation.  Abnormal ecg, new.   Patient is advised the following: Patient Instructions  A heart test is requested for you today.  you will receive a phone call, about a day and time for an appointment good diet and exercise significantly improve the control of your diabetes.  please let me know if you wish to be referred to a dietician.  high blood sugar is very risky to your health.  you should see an eye doctor and dentist every year.  It is very important to get all recommended vaccinations.  controlling your blood pressure and cholesterol drastically reduces the damage diabetes does to your body.  Those who smoke should quit.  please discuss these with your doctor.  check your blood sugar once a day.  vary the time of day when you check, between before the 3 meals, and at bedtime.  also check if you have symptoms of your blood sugar being too high or too low.  please keep a record of the readings and bring it to your next appointment here.  You can write it on any piece of paper.  please call us sooner if your blood sugar goes below 70, or if you have a lot of readings over 200.  blood tests are requested for you today.  We'll let you know about the results. Your blood sugar is too high.  To get it back where it should be, you may need insulin.  An alternative is to take  several additional pills.  Please think this over and let us know. Cristy Folks (here Mon, Tue, Wed) can show you how to inject insulin.  Please see her next week. Please come back for a follow-up appointment in 1 month.

## 2014-12-03 NOTE — Patient Instructions (Addendum)
A heart test is requested for you today.  you will receive a phone call, about a day and time for an appointment good diet and exercise significantly improve the control of your diabetes.  please let me know if you wish to be referred to a dietician.  high blood sugar is very risky to your health.  you should see an eye doctor and dentist every year.  It is very important to get all recommended vaccinations.  controlling your blood pressure and cholesterol drastically reduces the damage diabetes does to your body.  Those who smoke should quit.  please discuss these with your doctor.  check your blood sugar once a day.  vary the time of day when you check, between before the 3 meals, and at bedtime.  also check if you have symptoms of your blood sugar being too high or too low.  please keep a record of the readings and bring it to your next appointment here.  You can write it on any piece of paper.  please call us sooner if your blood sugar goes below 70, or if you have a lot of readings over 200.  blood tests are requested for you today.  We'll let you know about the results. Your blood sugar is too high.  To get it back where it should be, you may need insulin.  An alternative is to take several additional pills.  Please think this over and let us know. Cristy Folks (here Mon, Tue, Wed) can show you how to inject insulin.  Please see her next week. Please come back for a follow-up appointment in 1 month.

## 2014-12-07 ENCOUNTER — Encounter: Payer: 59 | Attending: Endocrinology | Admitting: Nutrition

## 2014-12-07 DIAGNOSIS — Z713 Dietary counseling and surveillance: Secondary | ICD-10-CM | POA: Diagnosis not present

## 2014-12-07 DIAGNOSIS — E1165 Type 2 diabetes mellitus with hyperglycemia: Secondary | ICD-10-CM | POA: Insufficient documentation

## 2014-12-07 NOTE — Patient Instructions (Signed)
Test before and two hours after one meal each day--varying the meal each day.   Write those readings on the sheet given and bring with you on you next visit with Dr. Everardo All in one month Add 1-2 ounces of protein to breakfast and lunch meals each day.

## 2014-12-07 NOTE — Progress Notes (Signed)
Pt. Is testing blood sugars q 2-3 days, 4 hours after breakfast.  The reading vary from 189-297.   He has had a series of cortisone injections into his R knee, causing is blood sugars to rise, and needing the oral agents of Glipizide and Invokanna.  He continues to take the Metformin.  He says that these medications have caused him to be more hungry, and he has gained 15 pounds. 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  He is only riding in the car,and doing mail box drop offs.    Typical day: 6:30 AM: up and breakfast of salad with fruit.  Water to drink.   10-11AM:  Piece of fruit and bag of chips or Cheetos.   12-12:30PM: lunch of another salad with fruit in it. 4PM: fruit or another bag of chips or Cheetos 6-6:30 PM:  Supper:  Fish (fried or baked-usually fried), french fries, greens:  Or 2 hot dogs with chips or fries, water to drink  9PM: snack of another piece of fish or another hot dog with chips or fries.  Diet changes suggested:  1.  Add protein to breakfast meals--suggestions given for this for 1-2 ounces 2.  Add protein to lunch meals. 3.  Decrease the breads at supper when eating hot dog to 1 bun, and only one order of fries.    He was shown how to dial up and inject insulin using a pen, and we discussed the side effects, and timings of long acting as well as short acting insulins.  He had no questions. At this time.  He had not test his blood sugars in 3 days--saying his meter had lost power--and he did not recharge it as yet.  He was given another One Touch Verio that runs on batteries to use when recharging his other meter.  He was told to test ac and 2hr. pc one meal each day--varying the meal each day for one month.  He was given a log to write those readings down.  He was told to bring it to his appt. With Dr. Everardo All in one month.  He agreed to do this and had no final questions.

## 2014-12-09 ENCOUNTER — Other Ambulatory Visit: Payer: Self-pay

## 2014-12-09 ENCOUNTER — Ambulatory Visit (HOSPITAL_COMMUNITY): Payer: 59 | Attending: Cardiology

## 2014-12-09 DIAGNOSIS — I10 Essential (primary) hypertension: Secondary | ICD-10-CM | POA: Diagnosis not present

## 2014-12-09 DIAGNOSIS — E119 Type 2 diabetes mellitus without complications: Secondary | ICD-10-CM | POA: Diagnosis not present

## 2014-12-09 DIAGNOSIS — I340 Nonrheumatic mitral (valve) insufficiency: Secondary | ICD-10-CM | POA: Insufficient documentation

## 2014-12-09 DIAGNOSIS — R9431 Abnormal electrocardiogram [ECG] [EKG]: Secondary | ICD-10-CM | POA: Diagnosis not present

## 2014-12-24 ENCOUNTER — Telehealth: Payer: Self-pay | Admitting: Endocrinology

## 2014-12-24 MED ORDER — METFORMIN HCL 1000 MG PO TABS: ORAL_TABLET | ORAL | Status: DC

## 2014-12-24 NOTE — Telephone Encounter (Signed)
See note below and please advise, Thanks! 

## 2014-12-24 NOTE — Telephone Encounter (Signed)
Pt advised note below and advised metformin has been sent.

## 2014-12-24 NOTE — Telephone Encounter (Signed)
Please refill metformin prn Please refer request for lisinopril to PCP

## 2014-12-24 NOTE — Telephone Encounter (Signed)
Patient need a refill of lisinopril-hydrochlorothiazide (PRINZIDE,ZESTORETIC) 20-12.5 , metFORMIN (GLUCOPHAGE) 1000 MG tablet send to   [facility] - [place] ([place]), [place] - [address] [phone] (Phone) [phone] (Fax)

## 2014-12-28 ENCOUNTER — Other Ambulatory Visit: Payer: Self-pay | Admitting: Family Medicine

## 2014-12-30 ENCOUNTER — Other Ambulatory Visit: Payer: Self-pay | Admitting: Family Medicine

## 2014-12-31 ENCOUNTER — Telehealth: Payer: Self-pay

## 2014-12-31 ENCOUNTER — Other Ambulatory Visit: Payer: Self-pay | Admitting: Family Medicine

## 2014-12-31 MED ORDER — LISINOPRIL-HYDROCHLOROTHIAZIDE 20-12.5 MG PO TABS: 1.0000 | ORAL_TABLET | Freq: Every day | ORAL | Status: DC

## 2014-12-31 NOTE — Telephone Encounter (Signed)
I have sent in a 30d supply of his BP medication but he must have an appt before he gets more.  Please call and make him and appt.

## 2014-12-31 NOTE — Telephone Encounter (Signed)
Per Maralyn Sago she will refill patients bp med refill on lisinopril for 30 days.  He will return to the walkin center for an appointment after that for additional refills.[phone]

## 2015-01-03 ENCOUNTER — Other Ambulatory Visit: Payer: Self-pay

## 2015-01-03 ENCOUNTER — Ambulatory Visit (INDEPENDENT_AMBULATORY_CARE_PROVIDER_SITE_OTHER): Payer: 59 | Admitting: Endocrinology

## 2015-01-03 VITALS — BP 134/86 | HR 92 | Temp 97.9°F | Ht 70.0 in | Wt 215.4 lb

## 2015-01-03 DIAGNOSIS — R739 Hyperglycemia, unspecified: Secondary | ICD-10-CM

## 2015-01-03 MED ORDER — DAPAGLIFLOZIN PROPANEDIOL 10 MG PO TABS: 10.0000 mg | ORAL_TABLET | Freq: Every day | ORAL | Status: DC

## 2015-01-03 MED ORDER — LINAGLIPTIN 5 MG PO TABS: 5.0000 mg | ORAL_TABLET | Freq: Every day | ORAL | Status: DC

## 2015-01-03 MED ORDER — GLIPIZIDE 5 MG PO TABS: 5.0000 mg | ORAL_TABLET | Freq: Two times a day (BID) | ORAL | Status: DC

## 2015-01-03 MED ORDER — METFORMIN HCL 1000 MG PO TABS: ORAL_TABLET | ORAL | Status: DC

## 2015-01-03 NOTE — Progress Notes (Signed)
Subjective:    Patient ID: Zachary Walsh, male    DOB: Oct 19, 1960, 54 y.o.   MRN: [id number]  HPI  Pt returns for f/u of diabetes mellitus: DM type: 2 Dx'ed: 2004 Complications: polyneuropathy.  Therapy: 3 oral meds.  DKA: never Severe hypoglycemia: never Pancreatitis: never Other: In early 2016, he was seen in ER for severe hyperglycemia after a steroid injection; he has never been on insulin Interval history: no cbg record, but states cbg's are in the 200's. pt states he feels well in general.  Ins does not cover invokana. Past Medical History  Diagnosis Date  . Hypertension   . Diabetes mellitus   . Seasonal allergies     Past Surgical History  Procedure Laterality Date  . Right leg fracture    . Hernia repair      Social History   Social History  . Marital Status: Married    Spouse Name: N/A  . Number of Children: N/A  . Years of Education: N/A   Occupational History  . Not on file.   Social History Main Topics  . Smoking status: Never Smoker   . Smokeless tobacco: Not on file  . Alcohol Use: No  . Drug Use: No  . Sexual Activity: Not on file   Other Topics Concern  . Not on file   Social History Narrative    Current Outpatient Prescriptions on File Prior to Visit  Medication Sig Dispense Refill  . aspirin 81 MG chewable tablet Chew 81 mg by mouth daily.    . Glucose Blood (BLOOD GLUCOSE TEST STRIPS) STRP 1 application by In Vitro route 3 (three) times daily as needed. (Patient taking differently: 1 application by In Vitro route 3 (three) times daily as needed. One touch verio) 100 each 3  . lisinopril-hydrochlorothiazide (PRINZIDE,ZESTORETIC) 20-12.5 MG per tablet Take 1 tablet by mouth daily. PATIENT NEEDS BLOOD PRESSURE CHECK UP FOR ADDITIONAL REFILLS 30 tablet 0  . cyclobenzaprine (FLEXERIL) 5 MG tablet Take 1 tablet (5 mg total) by mouth at bedtime as needed for muscle spasms. (Patient not taking: Reported on 01/03/2015) 30 tablet 0  .  HYDROcodone-acetaminophen (NORCO) 5-325 MG per tablet Take 1 tablet by mouth every 8 (eight) hours as needed for severe pain. Do not take extra tylenol with this (Patient not taking: Reported on 01/03/2015) 30 tablet 0  . meloxicam (MOBIC) 7.5 MG tablet Take 1 tablet (7.5 mg total) by mouth daily. No other NSAID, take with food (Patient not taking: Reported on 01/03/2015) 14 tablet 0  . sildenafil (VIAGRA) 100 MG tablet Take 1 tablet (100 mg total) by mouth daily as needed for erectile dysfunction. (Patient not taking: Reported on 01/03/2015) 10 tablet 11   No current facility-administered medications on file prior to visit.    Allergies  Allergen Reactions  . Benadryl [Diphenhydramine Hcl] Anxiety    Family History  Problem Relation Age of Onset  . Heart disease Mother   . Heart disease Father   . Diabetes Father   . Heart failure Mother   . Heart failure Father     BP 134/86 mmHg  Pulse 92  Temp(Src) 97.9 F (36.6 C)  Ht  (1.778 m)  Wt 215 lb 6 oz (97.693 kg)  BMI 30.90 kg/m2  SpO2 96%  Review of Systems He denies hypoglycemia    Objective:   Physical Exam VITAL SIGNS:  See vs page GENERAL: no distress Pulses: dorsalis pedis intact bilat.   MSK: no deformity of the  feet CV: no leg edema Skin:  no ulcer on the feet.  normal color and temp on the feet. Neuro: sensation is intact to touch on the feet   Lab Results  Component Value Date   HGBA1C 9.5 12/03/2014      Assessment & Plan:  DM: he needs increased rx.  Patient is advised the following: Patient Instructions  check your blood sugar once a day.  vary the time of day when you check, between before the 3 meals, and at bedtime.  also check if you have symptoms of your blood sugar being too high or too low.  please keep a record of the readings and bring it to your next appointment here.  You can write it on any piece of paper.  please call us sooner if your blood sugar goes below 70, or if you have a lot of  readings over 200.  Please change the invokana to farxiga, and add tradjenta.  i have sent prescriptions to your pharmacy.   Please come back for a follow-up appointment in 2 months.

## 2015-01-03 NOTE — Patient Instructions (Addendum)
check your blood sugar once a day.  vary the time of day when you check, between before the 3 meals, and at bedtime.  also check if you have symptoms of your blood sugar being too high or too low.  please keep a record of the readings and bring it to your next appointment here.  You can write it on any piece of paper.  please call us sooner if your blood sugar goes below 70, or if you have a lot of readings over 200.  Please change the invokana to farxiga, and add tradjenta.  i have sent prescriptions to your pharmacy.   Please come back for a follow-up appointment in 2 months.

## 2015-01-03 NOTE — Telephone Encounter (Signed)
I tried to get to make the patient schedule an appointment the day he was here.  He did not want to schedule, but just will come back into the walk-in.  He has been advised that there will be no more refills until he comes back in.

## 2015-01-28 ENCOUNTER — Ambulatory Visit (INDEPENDENT_AMBULATORY_CARE_PROVIDER_SITE_OTHER): Payer: 59 | Admitting: Family Medicine

## 2015-01-28 VITALS — BP 126/60 | HR 94 | Temp 99.1°F | Resp 16 | Ht 70.0 in | Wt 218.0 lb

## 2015-01-28 DIAGNOSIS — J069 Acute upper respiratory infection, unspecified: Secondary | ICD-10-CM | POA: Diagnosis not present

## 2015-01-28 DIAGNOSIS — I10 Essential (primary) hypertension: Secondary | ICD-10-CM

## 2015-01-28 DIAGNOSIS — R05 Cough: Secondary | ICD-10-CM | POA: Diagnosis not present

## 2015-01-28 DIAGNOSIS — R059 Cough, unspecified: Secondary | ICD-10-CM

## 2015-01-28 MED ORDER — LISINOPRIL-HYDROCHLOROTHIAZIDE 20-12.5 MG PO TABS: ORAL_TABLET | ORAL | Status: DC

## 2015-01-28 MED ORDER — BENZONATATE 100 MG PO CAPS: 100.0000 mg | ORAL_CAPSULE | Freq: Three times a day (TID) | ORAL | Status: DC | PRN

## 2015-01-28 NOTE — Progress Notes (Signed)
Patient ID: Zachary Walsh, male    DOB: [birth year]  Age: 54 y.o. MRN: [id number]  Chief Complaint  Patient presents with  . Cough    x 1 week  . Sinusitis    x 1 week  . Sore Throat    on monday started to feel scrachy  . Medication Refill    BP meds    Subjective:   Patient is here for a refill of his blood pressure medication. He apparently has talked to Dr. Romero Belling, his diabetic doctor, about getting his blood pressure managed there. He was told that he needed a referral from [place]. I will send a letter, but often the diabetic doctors don't want to manage the blood pressure medications.  The patient been doing well except for he has had a cold over the last 5 or 6 days. He had little sore throat, now has a cough and some head congestion. No fever except for a 99.1 temperature here today.  Current allergies, medications, problem list, past/family and social histories reviewed.  Objective:  BP 126/60 mmHg  Pulse 94  Temp(Src) 99.1 F (37.3 C) (Oral)  Resp 16  Ht  (1.778 m)  Wt 218 lb (98.884 kg)  BMI 31.28 kg/m2  SpO2 97%  TMs are normal. Throat clear. Neck supple without nodes. Chest is clear to all station. Heart regular without murmurs. He is coughing a moderate degree.  Assessment & Plan:   Assessment: 1. Essential hypertension   2. Cough   3. Acute upper respiratory infection       Plan: Will treat the cough symptomatically. See instructions. If he gets worse he is to return.  Sent a note to his diabetic doctor regarding management of his blood pressure medication. I did refill medications today.  Meds ordered this encounter  Medications  . lisinopril-hydrochlorothiazide (PRINZIDE,ZESTORETIC) 20-12.5 MG tablet    Sig: Take one daily for blood pressure    Dispense:  30 tablet    Refill:  5  . benzonatate (TESSALON) 100 MG capsule    Sig: Take 1-2 capsules (100-200 mg total) by mouth 3 (three) times daily as needed.    Dispense:  30 capsule   Refill:  0         Patient Instructions  Continue your current blood pressure medication one daily  Take benzonatate 1 or 2 pills 3 times daily as needed for cough  Take over-the-counter Claritin-D (loratadine D) or Zyrtec-D (cetirizine D) if needed for head congestion  Drink plenty of fluids and get enough rest  Return if not improving     Return in about 6 months (around 07/29/2015).   Lizvette Lightsey, MD 01/28/2015

## 2015-01-28 NOTE — Patient Instructions (Signed)
Continue your current blood pressure medication one daily  Take benzonatate 1 or 2 pills 3 times daily as needed for cough  Take over-the-counter Claritin-D (loratadine D) or Zyrtec-D (cetirizine D) if needed for head congestion  Drink plenty of fluids and get enough rest  Return if not improving

## 2015-02-11 ENCOUNTER — Telehealth: Payer: Self-pay | Admitting: Endocrinology

## 2015-02-11 NOTE — Telephone Encounter (Signed)
Onalee Huaavid  Thanks for your message about Mr Zachary Walsh's HTN I agree that I should step aside from his blood pressure, as this is outside my specialty Thanks again  Romero BellingSean Infant Zink

## 2015-03-07 ENCOUNTER — Ambulatory Visit: Payer: Self-pay | Admitting: Endocrinology

## 2015-03-08 ENCOUNTER — Ambulatory Visit (INDEPENDENT_AMBULATORY_CARE_PROVIDER_SITE_OTHER): Payer: 59 | Admitting: Endocrinology

## 2015-03-08 ENCOUNTER — Encounter: Payer: Self-pay | Admitting: Endocrinology

## 2015-03-08 ENCOUNTER — Encounter: Payer: 59 | Attending: Endocrinology | Admitting: Nutrition

## 2015-03-08 VITALS — BP 132/84 | HR 81 | Temp 98.0°F | Ht 70.0 in | Wt 217.0 lb

## 2015-03-08 DIAGNOSIS — E1165 Type 2 diabetes mellitus with hyperglycemia: Secondary | ICD-10-CM | POA: Diagnosis not present

## 2015-03-08 DIAGNOSIS — Z713 Dietary counseling and surveillance: Secondary | ICD-10-CM | POA: Insufficient documentation

## 2015-03-08 LAB — POCT GLYCOSYLATED HEMOGLOBIN (HGB A1C): Hemoglobin A1C: 7.9

## 2015-03-08 MED ORDER — SILDENAFIL CITRATE 20 MG PO TABS: ORAL_TABLET | ORAL | Status: DC

## 2015-03-08 MED ORDER — GLIPIZIDE ER 2.5 MG PO TB24: 2.5000 mg | ORAL_TABLET | Freq: Every day | ORAL | Status: DC

## 2015-03-08 MED ORDER — DULAGLUTIDE 1.5 MG/0.5ML ~~LOC~~ SOAJ
1.5000 mg | SUBCUTANEOUS | Status: DC
Start: 2015-03-08 — End: 2015-06-08

## 2015-03-08 NOTE — Patient Instructions (Addendum)
check your blood sugar once a day.  vary the time of day when you check, between before the 3 meals, and at bedtime.  also check if you have symptoms of your blood sugar being too high or too low.  please keep a record of the readings and bring it to your next appointment here.  You can write it on any piece of paper.  please call us sooner if your blood sugar goes below 70, or if you have a lot of readings over 200.  Please change the tradjenta to "trulicity."  i have sent a prescription to your pharmacy.   Also, please reduce the glipizide to 2.5 mg daily. Please come back for a follow-up appointment in 3 months.

## 2015-03-08 NOTE — Progress Notes (Signed)
We discussed how the Trulicity works to control blood sugar.  He was given a brochure that explains this. He was shown how/where to inject, and we discussed the importance of site rotation. He reported good understanding of this and had no questions. We also discussed ways to help his insulin to work better and stressed the need for exercise.  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   Stressed need to test blood sugar before meals, and 2hr. After one meal each day--varying the meal each day.  He agreed to do this.   He had no final questions.

## 2015-03-08 NOTE — Progress Notes (Signed)
Subjective:    Patient ID: Zachary Walsh, male    DOB: 1960/05/07, 54 y.o.   MRN: [id number]  HPI Pt returns for f/u of diabetes mellitus: DM type: 2 Dx'ed: 2004 Complications: polyneuropathy.  Therapy: 4 oral meds.  DKA: never Severe hypoglycemia: never Pancreatitis: never Other: In early 2016, he was seen in ER for severe hyperglycemia after a steroid injection; he has never been on insulin; he declines pioglitizone.  Interval history: no cbg record, but states cbg's are in the 200's. pt states he feels well in general.  Ins does not cover invokana.  Past Medical History  Diagnosis Date  . Hypertension   . Diabetes mellitus   . Seasonal allergies     Past Surgical History  Procedure Laterality Date  . Right leg fracture    . Hernia repair      Social History   Social History  . Marital Status: Married    Spouse Name: N/A  . Number of Children: N/A  . Years of Education: N/A   Occupational History  . Not on file.   Social History Main Topics  . Smoking status: Never Smoker   . Smokeless tobacco: Not on file  . Alcohol Use: No  . Drug Use: No  . Sexual Activity: Not on file   Other Topics Concern  . Not on file   Social History Narrative    Current Outpatient Prescriptions on File Prior to Visit  Medication Sig Dispense Refill  . aspirin 81 MG chewable tablet Chew 81 mg by mouth daily.    . dapagliflozin propanediol (FARXIGA) 10 MG TABS tablet Take 10 mg by mouth daily. 30 tablet 11  . Glucose Blood (BLOOD GLUCOSE TEST STRIPS) STRP 1 application by In Vitro route 3 (three) times daily as needed. (Patient taking differently: 1 application by In Vitro route 3 (three) times daily as needed. One touch verio) 100 each 3  . linagliptin (TRADJENTA) 5 MG TABS tablet Take 1 tablet (5 mg total) by mouth daily. 30 tablet 11  . lisinopril-hydrochlorothiazide (PRINZIDE,ZESTORETIC) 20-12.5 MG tablet Take one daily for blood pressure 30 tablet 5  . metFORMIN (GLUCOPHAGE)  1000 MG tablet Take 1 tablet by mouth twice daily with meals. 60 tablet 3   No current facility-administered medications on file prior to visit.    Allergies  Allergen Reactions  . Benadryl [Diphenhydramine Hcl] Anxiety    Family History  Problem Relation Age of Onset  . Heart disease Mother   . Heart disease Father   . Diabetes Father   . Heart failure Mother   . Heart failure Father     BP 132/84 mmHg  Pulse 81  Temp(Src) 98 F (36.7 C) (Oral)  Ht 5\' 10"  (1.778 m)  Wt 217 lb (98.431 kg)  BMI 31.14 kg/m2  SpO2 95%   Review of Systems jhe denies hypoglycemia    Objective:   Physical Exam VITAL SIGNS:  See vs page GENERAL: no distress Pulses: dorsalis pedis intact bilat.   MSK: no deformity of the feet CV: no leg edema Skin:  no ulcer on the feet.  normal color and temp on the feet. Neuro: sensation is intact to touch on the feet.  I discussed with Cristy FolksLinda Spagnola, RN, about Trulicity.  She will train pt about this.     A1c=7.9%    Assessment & Plan:  DM: he needs increased rx, if it can be done with a regimen that avoids or minimizes hypoglycemia.    Patient  is advised the following: Patient Instructions  check your blood sugar once a day.  vary the time of day when you check, between before the 3 meals, and at bedtime.  also check if you have symptoms of your blood sugar being too high or too low.  please keep a record of the readings and bring it to your next appointment here.  You can write it on any piece of paper.  please call us sooner if your blood sugar goes below 70, or if you have a lot of readings over 200.  Please change the tradjenta to "trulicity."  i have sent a prescription to your pharmacy.   Also, please reduce the glipizide to 2.5 mg daily. Please come back for a follow-up appointment in 3 months.

## 2015-03-08 NOTE — Patient Instructions (Signed)
Inject Trulicity once a week as directed Test blood sugar before and 2 hours after one meal each day. Call if questions.

## 2015-03-16 ENCOUNTER — Other Ambulatory Visit: Payer: Self-pay | Admitting: *Deleted

## 2015-03-16 ENCOUNTER — Telehealth: Payer: Self-pay | Admitting: Endocrinology

## 2015-03-16 MED ORDER — LINAGLIPTIN 5 MG PO TABS: 5.0000 mg | ORAL_TABLET | Freq: Every day | ORAL | Status: DC

## 2015-03-16 NOTE — Telephone Encounter (Signed)
Patient came into the office today and was requesting a new Rx   Rx: Tradjenta 90 day supply   Pharmacy: Please send to [facility] on [address]    Thank you

## 2015-03-16 NOTE — Telephone Encounter (Signed)
rx sent

## 2015-03-18 ENCOUNTER — Other Ambulatory Visit: Payer: Self-pay | Admitting: Endocrinology

## 2015-04-11 ENCOUNTER — Other Ambulatory Visit: Payer: Self-pay

## 2015-04-11 MED ORDER — GLIPIZIDE ER 2.5 MG PO TB24: 2.5000 mg | ORAL_TABLET | Freq: Every day | ORAL | Status: DC

## 2015-04-14 ENCOUNTER — Telehealth: Payer: Self-pay | Admitting: Endocrinology

## 2015-04-14 NOTE — Telephone Encounter (Signed)
Could you review and please advise if the medication should be sent. Rx is not on current med list. Thanks!

## 2015-04-14 NOTE — Telephone Encounter (Signed)
[facility] needs a RX for invokana 90 day supply

## 2015-04-15 NOTE — Telephone Encounter (Signed)
Due to insurance, it was changed to "farxiga"

## 2015-04-26 ENCOUNTER — Telehealth: Payer: Self-pay | Admitting: Endocrinology

## 2015-04-26 DIAGNOSIS — I10 Essential (primary) hypertension: Secondary | ICD-10-CM

## 2015-04-26 MED ORDER — METFORMIN HCL 1000 MG PO TABS: ORAL_TABLET | ORAL | Status: DC

## 2015-04-26 MED ORDER — GLIPIZIDE ER 2.5 MG PO TB24: 2.5000 mg | ORAL_TABLET | Freq: Every day | ORAL | Status: DC

## 2015-04-26 MED ORDER — LISINOPRIL-HYDROCHLOROTHIAZIDE 20-12.5 MG PO TABS: ORAL_TABLET | ORAL | Status: DC

## 2015-04-26 NOTE — Telephone Encounter (Signed)
Noted  

## 2015-04-26 NOTE — Telephone Encounter (Signed)
All pt RX need to be called into [facility] on [address] must all have 90 day supply-glipizide, lisinopril, metformin.

## 2015-04-26 NOTE — Telephone Encounter (Signed)
Rx sent per pt's request.  

## 2015-05-20 ENCOUNTER — Other Ambulatory Visit: Payer: Self-pay

## 2015-05-20 MED ORDER — GLIPIZIDE ER 2.5 MG PO TB24: 2.5000 mg | ORAL_TABLET | Freq: Every day | ORAL | Status: DC

## 2015-06-08 ENCOUNTER — Ambulatory Visit (INDEPENDENT_AMBULATORY_CARE_PROVIDER_SITE_OTHER): Payer: 59 | Admitting: Endocrinology

## 2015-06-08 ENCOUNTER — Encounter: Payer: Self-pay | Admitting: Endocrinology

## 2015-06-08 VITALS — BP 128/80 | HR 99 | Temp 98.4°F | Ht 70.0 in | Wt 221.0 lb

## 2015-06-08 DIAGNOSIS — E1165 Type 2 diabetes mellitus with hyperglycemia: Secondary | ICD-10-CM

## 2015-06-08 LAB — POCT GLYCOSYLATED HEMOGLOBIN (HGB A1C): Hemoglobin A1C: 7.9

## 2015-06-08 MED ORDER — DULAGLUTIDE 1.5 MG/0.5ML ~~LOC~~ SOAJ: 1.5000 mg | SUBCUTANEOUS | Status: DC

## 2015-06-08 NOTE — Patient Instructions (Addendum)
check your blood sugar once a day.  vary the time of day when you check, between before the 3 meals, and at bedtime.  also check if you have symptoms of your blood sugar being too high or too low.  please keep a record of the readings and bring it to your next appointment here.  You can write it on any piece of paper.  please call us sooner if your blood sugar goes below 70, or if you have a lot of readings over 200.  Please change the tradjenta to "trulicity."  i have sent a prescription to your pharmacy.   please call [phone], to get an appointment with a new primary doctor.   Please come back for a follow-up appointment in 3 months.

## 2015-06-08 NOTE — Progress Notes (Signed)
Subjective:    Patient ID: Zachary Walsh, male    DOB: [birth year], 55 y.o.   MRN: [id number]  HPI Pt returns for f/u of diabetes mellitus: DM type: 2 Dx'ed: 2004 Complications: polyneuropathy.  Therapy: 4 oral meds.  DKA: never Severe hypoglycemia: never Pancreatitis: never Other: In early 2016, he was seen in ER for severe hyperglycemia after a steroid injection; he has never been on insulin; he declines pioglitizone; ins does not cover invokana.  Interval history: He did not change tradjenta to trulicity.  pt states he feels well in general.  no cbg record, but states cbg's are well-controlled Past Medical History  Diagnosis Date  . Hypertension   . Diabetes mellitus   . Seasonal allergies     Past Surgical History  Procedure Laterality Date  . Right leg fracture    . Hernia repair      Social History   Social History  . Marital Status: Married    Spouse Name: N/A  . Number of Children: N/A  . Years of Education: N/A   Occupational History  . Not on file.   Social History Main Topics  . Smoking status: Never Smoker   . Smokeless tobacco: Not on file  . Alcohol Use: No  . Drug Use: No  . Sexual Activity: Not on file   Other Topics Concern  . Not on file   Social History Narrative    Current Outpatient Prescriptions on File Prior to Visit  Medication Sig Dispense Refill  . aspirin 81 MG chewable tablet Chew 81 mg by mouth daily.    . dapagliflozin propanediol (FARXIGA) 10 MG TABS tablet Take 10 mg by mouth daily. 30 tablet 11  . glipiZIDE (GLUCOTROL XL) 2.5 MG 24 hr tablet Take 1 tablet (2.5 mg total) by mouth daily with breakfast. 90 tablet 2  . Glucose Blood (BLOOD GLUCOSE TEST STRIPS) STRP 1 application by In Vitro route 3 (three) times daily as needed. (Patient taking differently: 1 application by In Vitro route 3 (three) times daily as needed. One touch verio) 100 each 3  . lisinopril-hydrochlorothiazide (PRINZIDE,ZESTORETIC) 20-12.5 MG tablet Take one  daily for blood pressure 90 tablet 2  . metFORMIN (GLUCOPHAGE) 1000 MG tablet Take 1 tablet by mouth twice daily with meals. 180 tablet 2  . sildenafil (REVATIO) 20 MG tablet 2-5 pills as needed for ED symptoms 50 tablet 11   No current facility-administered medications on file prior to visit.    Allergies  Allergen Reactions  . Benadryl [Diphenhydramine Hcl] Anxiety    Family History  Problem Relation Age of Onset  . Heart disease Mother   . Heart disease Father   . Diabetes Father   . Heart failure Mother   . Heart failure Father     BP 128/80 mmHg  Pulse 99  Temp(Src) 98.4 F (36.9 C) (Oral)  Ht  (1.778 m)  Wt 221 lb (100.245 kg)  BMI 31.71 kg/m2  SpO2 94%  Review of Systems He denies hypoglycemia    Objective:   Physical Exam VITAL SIGNS:  See vs page GENERAL: no distress Pulses: dorsalis pedis intact bilat.   MSK: no deformity of the feet CV: no leg edema Skin:  no ulcer on the feet.  normal color and temp on the feet. Neuro: sensation is intact to touch on the feet    A1c=7.9%    Assessment & Plan:  DM: he needs increased rx, if it can be done with a regimen that  avoids or minimizes hypoglycemia.   Patient is advised the following: Patient Instructions  check your blood sugar once a day.  vary the time of day when you check, between before the 3 meals, and at bedtime.  also check if you have symptoms of your blood sugar being too high or too low.  please keep a record of the readings and bring it to your next appointment here.  You can write it on any piece of paper.  please call us sooner if your blood sugar goes below 70, or if you have a lot of readings over 200.  Please change the tradjenta to "trulicity."  i have sent a prescription to your pharmacy.   please call [phone], to get an appointment with a new primary doctor.   Please come back for a follow-up appointment in 3 months.

## 2015-06-12 ENCOUNTER — Other Ambulatory Visit: Payer: Self-pay | Admitting: Endocrinology

## 2015-06-13 NOTE — Telephone Encounter (Signed)
Please advise if ok to refill. Medication is not on current med list. Thanks!  

## 2015-06-13 NOTE — Telephone Encounter (Signed)
This med was d/ced

## 2015-06-18 ENCOUNTER — Other Ambulatory Visit: Payer: Self-pay | Admitting: Endocrinology

## 2015-06-20 NOTE — Telephone Encounter (Signed)
This med was d/ced

## 2015-06-20 NOTE — Telephone Encounter (Signed)
Please advise if ok to refill. Rx is not on pt's current med list.

## 2015-06-21 ENCOUNTER — Telehealth: Payer: Self-pay | Admitting: Endocrinology

## 2015-06-21 NOTE — Telephone Encounter (Signed)
Pt said that he is out of refills and needs a new Rx for Tradjenta [facility] into [facility] [address]

## 2015-06-21 NOTE — Telephone Encounter (Signed)
I contacted the pt and advised during last office visit tradjenta had been d/c. Pt verbalized understanding.

## 2015-07-04 ENCOUNTER — Other Ambulatory Visit: Payer: Self-pay | Admitting: Endocrinology

## 2015-07-20 ENCOUNTER — Telehealth: Payer: Self-pay | Admitting: Endocrinology

## 2015-07-20 NOTE — Telephone Encounter (Addendum)
I contacted the pt and advised per Dr. George HughEllison's last note the trajenta has been DC. Pt is to take, Metformin Trulicity, Farxiga and Glipizide. Pt advised of note below and voiced understanding.

## 2015-07-20 NOTE — Telephone Encounter (Signed)
Pt needs to know what meds he is supposed to be on and what he is supposed to stop

## 2015-09-01 ENCOUNTER — Telehealth: Payer: Self-pay | Admitting: Endocrinology

## 2015-09-01 NOTE — Telephone Encounter (Signed)
I contacted the pt and advised our office had called about a reminder appointment on 09/05/2015.

## 2015-09-01 NOTE — Telephone Encounter (Signed)
[facility] Call: Caller states he missed and call and would like a call back.

## 2015-09-05 ENCOUNTER — Encounter: Payer: Self-pay | Admitting: Endocrinology

## 2015-09-05 ENCOUNTER — Ambulatory Visit (INDEPENDENT_AMBULATORY_CARE_PROVIDER_SITE_OTHER): Payer: 59 | Admitting: Endocrinology

## 2015-09-05 VITALS — BP 128/82 | HR 90 | Temp 98.5°F | Ht 70.0 in | Wt 211.0 lb

## 2015-09-05 DIAGNOSIS — E1165 Type 2 diabetes mellitus with hyperglycemia: Secondary | ICD-10-CM

## 2015-09-05 LAB — POCT GLYCOSYLATED HEMOGLOBIN (HGB A1C): HEMOGLOBIN A1C: 8.9

## 2015-09-05 MED ORDER — DULAGLUTIDE 1.5 MG/0.5ML ~~LOC~~ SOAJ: 1.5000 mg | SUBCUTANEOUS | Status: DC

## 2015-09-05 MED ORDER — GLIPIZIDE ER 2.5 MG PO TB24: 2.5000 mg | ORAL_TABLET | Freq: Every day | ORAL | Status: DC

## 2015-09-05 NOTE — Progress Notes (Signed)
Subjective:    Patient ID: Zachary Walsh, male    DOB: 1960/07/15, 55 y.o.   MRN: [id number]  HPI Pt returns for f/u of diabetes mellitus: DM type: 2 Dx'ed: 2004 Complications: polyneuropathy.  Therapy: 4 oral meds.  DKA: never.  Severe hypoglycemia: never Pancreatitis: never Other: In early 2016, he was seen in ER for severe hyperglycemia after a steroid injection; he has never been on insulin; he declines pioglitizone; ins did not cover invokana.  Interval history: pt states he feels well in general.  no cbg record, but states cbg's are well-controlled.  He takes tradjenta, trulicity, metformin and farxiga only.  He does not take glipizide.   Past Medical History  Diagnosis Date  . Hypertension   . Diabetes mellitus   . Seasonal allergies     Past Surgical History  Procedure Laterality Date  . Right leg fracture    . Hernia repair      Social History   Social History  . Marital Status: Married    Spouse Name: N/A  . Number of Children: N/A  . Years of Education: N/A   Occupational History  . Not on file.   Social History Main Topics  . Smoking status: Never Smoker   . Smokeless tobacco: Not on file  . Alcohol Use: No  . Drug Use: No  . Sexual Activity: Not on file   Other Topics Concern  . Not on file   Social History Narrative    Current Outpatient Prescriptions on File Prior to Visit  Medication Sig Dispense Refill  . aspirin 81 MG chewable tablet Chew 81 mg by mouth daily.    . dapagliflozin propanediol (FARXIGA) 10 MG TABS tablet Take 10 mg by mouth daily. 30 tablet 11  . Glucose Blood (BLOOD GLUCOSE TEST STRIPS) STRP 1 application by In Vitro route 3 (three) times daily as needed. (Patient taking differently: 1 application by In Vitro route 3 (three) times daily as needed. One touch verio) 100 each 3  . lisinopril-hydrochlorothiazide (PRINZIDE,ZESTORETIC) 20-12.5 MG tablet Take one daily for blood pressure 90 tablet 2  . metFORMIN (GLUCOPHAGE) 1000  MG tablet Take 1 tablet by mouth twice daily with meals. 180 tablet 2  . sildenafil (REVATIO) 20 MG tablet 2-5 pills as needed for ED symptoms 50 tablet 11   No current facility-administered medications on file prior to visit.    Allergies  Allergen Reactions  . Benadryl [Diphenhydramine Hcl] Anxiety    Family History  Problem Relation Age of Onset  . Heart disease Mother   . Heart disease Father   . Diabetes Father   . Heart failure Mother   . Heart failure Father    BP 128/82 mmHg  Pulse 90  Temp(Src) 98.5 F (36.9 C) (Oral)  Ht 5\' 10"  (1.778 m)  Wt 211 lb (95.709 kg)  BMI 30.28 kg/m2  SpO2 96%  Review of Systems He denies hypoglycemia    Objective:   Physical Exam VITAL SIGNS:  See vs page GENERAL: no distress Pulses: dorsalis pedis intact bilat.   MSK: no deformity of the feet CV: no leg edema Skin:  no ulcer on the feet.  normal color and temp on the feet. Neuro: sensation is intact to touch on the feet.   A1c=8.9%    Assessment & Plan:  DM: he needs increased rx, but the tradjenta and trulicity are redundant.   Patient is advised the following: Patient Instructions  check your blood sugar once a day.  vary the time of day when you check, between before the 3 meals, and at bedtime.  also check if you have symptoms of your blood sugar being too high or too low.  please keep a record of the readings and bring it to your next appointment here.  You can write it on any piece of paper.  please call us sooner if your blood sugar goes below 70, or if you have a lot of readings over 200.  You don't need to take the tradjenta.  Please take the meds on the list below.  Please resume the glipizide.  I have sent a prescription to your pharmacy.    Please come back for a follow-up appointment in 3 months.

## 2015-09-05 NOTE — Patient Instructions (Addendum)
check your blood sugar once a day.  vary the time of day when you check, between before the 3 meals, and at bedtime.  also check if you have symptoms of your blood sugar being too high or too low.  please keep a record of the readings and bring it to your next appointment here.  You can write it on any piece of paper.  please call us sooner if your blood sugar goes below 70, or if you have a lot of readings over 200.  You don't need to take the tradjenta.  Please take the meds on the list below.  Please resume the glipizide.  I have sent a prescription to your pharmacy.    Please come back for a follow-up appointment in 3 months.

## 2015-10-04 ENCOUNTER — Other Ambulatory Visit: Payer: Self-pay | Admitting: Endocrinology

## 2015-10-04 NOTE — Telephone Encounter (Signed)
Please advise if ok to refill. Rx is not on current med list. 

## 2015-11-03 ENCOUNTER — Other Ambulatory Visit: Payer: Self-pay | Admitting: Endocrinology

## 2015-11-04 ENCOUNTER — Telehealth: Payer: Self-pay | Admitting: Endocrinology

## 2015-11-04 NOTE — Telephone Encounter (Signed)
PT needs refills of Farxiga sent to [facility] [address]

## 2015-11-07 MED ORDER — DAPAGLIFLOZIN PROPANEDIOL 10 MG PO TABS: 10.0000 mg | ORAL_TABLET | Freq: Every day | ORAL | Status: DC

## 2015-11-07 NOTE — Telephone Encounter (Signed)
Rx submitted per pt's request.  

## 2015-11-07 NOTE — Addendum Note (Signed)
Addended by: Ann MakiBAILEY, MEGAN T on: 11/07/2015 10:07 AM   Modules accepted: Orders

## 2015-11-08 ENCOUNTER — Other Ambulatory Visit: Payer: Self-pay

## 2015-11-08 MED ORDER — DAPAGLIFLOZIN PROPANEDIOL 10 MG PO TABS: 10.0000 mg | ORAL_TABLET | Freq: Every day | ORAL | Status: DC

## 2015-12-06 ENCOUNTER — Ambulatory Visit: Payer: Self-pay | Admitting: Endocrinology

## 2015-12-06 DIAGNOSIS — Z0289 Encounter for other administrative examinations: Secondary | ICD-10-CM

## 2016-01-23 ENCOUNTER — Other Ambulatory Visit: Payer: Self-pay | Admitting: Endocrinology

## 2016-01-23 DIAGNOSIS — I10 Essential (primary) hypertension: Secondary | ICD-10-CM

## 2016-05-30 ENCOUNTER — Ambulatory Visit (INDEPENDENT_AMBULATORY_CARE_PROVIDER_SITE_OTHER): Payer: 59 | Admitting: Family Medicine

## 2016-05-30 VITALS — BP 122/80 | HR 105 | Temp 98.4°F | Resp 17 | Ht 70.0 in | Wt 215.0 lb

## 2016-05-30 DIAGNOSIS — R6889 Other general symptoms and signs: Secondary | ICD-10-CM | POA: Diagnosis not present

## 2016-05-30 LAB — POCT INFLUENZA A/B
INFLUENZA A, POC: NEGATIVE
Influenza B, POC: NEGATIVE

## 2016-05-30 LAB — POCT CBC
Granulocyte percent: 47.9 %G (ref 37–80)
HCT, POC: 45.3 % (ref 43.5–53.7)
Hemoglobin: 15.8 g/dL (ref 14.1–18.1)
LYMPH, POC: 1.6 (ref 0.6–3.4)
MCH, POC: 31.2 pg (ref 27–31.2)
MCHC: 34.8 g/dL (ref 31.8–35.4)
MCV: 89.8 fL (ref 80–97)
MID (CBC): 0.4 (ref 0–0.9)
MPV: 8.2 fL (ref 0–99.8)
PLATELET COUNT, POC: 237 10*3/uL (ref 142–424)
POC Granulocyte: 1.9 — AB (ref 2–6.9)
POC LYMPH %: 42 % (ref 10–50)
POC MID %: 10.1 % (ref 0–12)
RBC: 5.04 M/uL (ref 4.69–6.13)
RDW, POC: 13.6 %
WBC: 3.9 10*3/uL — AB (ref 4.6–10.2)

## 2016-05-30 MED ORDER — RANITIDINE HCL 150 MG PO TABS: 150.0000 mg | ORAL_TABLET | Freq: Two times a day (BID) | ORAL | 0 refills | Status: DC

## 2016-05-30 MED ORDER — DIPHENOXYLATE-ATROPINE 2.5-0.025 MG PO TABS: 1.0000 | ORAL_TABLET | Freq: Four times a day (QID) | ORAL | 0 refills | Status: DC | PRN

## 2016-05-30 NOTE — Patient Instructions (Signed)
Start Ranitidine 150 mg twice daily for abdominal discomfort up 5 days.  For loose stools, take 1-2 Lomotil tablets up to 4 times daily as needed.  If illness hasn't resolved by Saturday, please return for care for additional work up.  IF you received an x-ray today, you will receive an invoice from [facility]. Please contact [facility] at [phone] with questions or concerns regarding your invoice.   IF you received labwork today, you will receive an invoice from [facility]. Please contact [facility] at [phone] with questions or concerns regarding your invoice.   Our billing staff will not be able to assist you with questions regarding bills from these companies.  You will be contacted with the lab results as soon as they are available. The fastest way to get your results is to activate your My Chart account. Instructions are located on the last page of this paperwork. If you have not heard from [place] regarding the results in 2 weeks, please contact this office.     Viral Illness, Adult Viruses are tiny germs that can get into a person's body and cause illness. There are many different types of viruses, and they cause many types of illness. Viral illnesses can range from mild to severe. They can affect various parts of the body. Common illnesses that are caused by a virus include colds and the flu. Viral illnesses also include serious conditions such as HIV/AIDS (human immunodeficiency virus/acquired immunodeficiency syndrome). A few viruses have been linked to certain cancers. What are the causes? Many types of viruses can cause illness. Viruses invade cells in your body, multiply, and cause the infected cells to malfunction or die. When the cell dies, it releases more of the virus. When this happens, you develop symptoms of the illness, and the virus continues to spread to other cells. If the virus takes over the function of the cell, it can cause the cell to divide and  grow out of control, as is the case when a virus causes cancer. Different viruses get into the body in different ways. You can get a virus by:  Swallowing food or water that is contaminated with the virus.  Breathing in droplets that have been coughed or sneezed into the air by an infected person.  Touching a surface that has been contaminated with the virus and then touching your eyes, nose, or mouth.  Being bitten by an insect or animal that carries the virus.  Having sexual contact with a person who is infected with the virus.  Being exposed to blood or fluids that contain the virus, either through an open cut or during a transfusion. If a virus enters your body, your body's defense system (immune system) will try to fight the virus. You may be at higher risk for a viral illness if your immune system is weak. What are the signs or symptoms? Symptoms vary depending on the type of virus and the location of the cells that it invades. Common symptoms of the main types of viral illnesses include: Cold and flu viruses  Fever.  Headache.  Sore throat.  Muscle aches.  Nasal congestion.  Cough. Digestive system (gastrointestinal) viruses  Fever.  Abdominal pain.  Nausea.  Diarrhea. Liver viruses (hepatitis)  Loss of appetite.  Tiredness.  Yellowing of the skin (jaundice). Brain and spinal cord viruses  Fever.  Headache.  Stiff neck.  Nausea and vomiting.  Confusion or sleepiness. Skin viruses  Warts.  Itching.  Rash. Sexually transmitted viruses  Discharge.  Swelling.  Redness.  Rash. How is this treated? Viruses can be difficult to treat because they live within cells. Antibiotic medicines do not treat viruses because these drugs do not get inside cells. Treatment for a viral illness may include:  Resting and drinking plenty of fluids.  Medicines to relieve symptoms. These can include over-the-counter medicine for pain and fever, medicines for  cough or congestion, and medicines to relieve diarrhea.  Antiviral medicines. These drugs are available only for certain types of viruses. They may help reduce flu symptoms if taken early. There are also many antiviral medicines for hepatitis and HIV/AIDS. Some viral illnesses can be prevented with vaccinations. A common example is the flu shot. Follow these instructions at home: Medicines  Take over-the-counter and prescription medicines only as told by your health care provider.  If you were prescribed an antiviral medicine, take it as told by your health care provider. Do not stop taking the medicine even if you start to feel better.  Be aware of when antibiotics are needed and when they are not needed. Antibiotics do not treat viruses. If your health care provider thinks that you may have a bacterial infection as well as a viral infection, you may get an antibiotic.  Do not ask for an antibiotic prescription if you have been diagnosed with a viral illness. That will not make your illness go away faster.  Frequently taking antibiotics when they are not needed can lead to antibiotic resistance. When this develops, the medicine no longer works against the bacteria that it normally fights. General instructions  Drink enough fluids to keep your urine clear or pale yellow.  Rest as much as possible.  Return to your normal activities as told by your health care provider. Ask your health care provider what activities are safe for you.  Keep all follow-up visits as told by your health care provider. This is important. How is this prevented? Take these actions to reduce your risk of viral infection:  Eat a healthy diet and get enough rest.  Wash your hands often with soap and water. This is especially important when you are in public places. If soap and water are not available, use hand sanitizer.  Avoid close contact with friends and family who have a viral illness.  If you travel to  areas where viral gastrointestinal infection is common, avoid drinking water or eating raw food.  Keep your immunizations up to date. Get a flu shot every year as told by your health care provider.  Do not share toothbrushes, nail clippers, razors, or needles with other people.  Always practice safe sex. Contact a health care provider if:  You have symptoms of a viral illness that do not go away.  Your symptoms come back after going away.  Your symptoms get worse. Get help right away if:  You have trouble breathing.  You have a severe headache or a stiff neck.  You have severe vomiting or abdominal pain. This information is not intended to replace advice given to you by your health care provider. Make sure you discuss any questions you have with your health care provider. Document Released: 08/19/2015 Document Revised: 09/21/2015 Document Reviewed: 08/19/2015 Elsevier Interactive Patient Education  2017 [employer]

## 2016-05-30 NOTE — Progress Notes (Signed)
Patient ID: Dan HumphreysJoel Duggan, male    DOB: 06-Sep-1960, 56 y.o.   MRN: [id number]  PCP: No PCP Per Patient  Chief Complaint  Patient presents with  . Diarrhea    ONset 3 days  . Abdominal Pain  . Fever    99.4 yesterday  . Fatigue    Subjective:  HPI 56 year old male presents for evaluation of abdominal pain, fever, and diarrhea x 3 days. Pt reports after routine day with 3 meals, prior to heading to bed he developed a sick feeling in his mid upper abdomen, sensation of "turning stomach", with an initial episode of diarrhea. This sequence of illness has persisted over the last 3 days with a low grade fever developing yesterday. He has missed work over the last two days. His only other symptoms has included headache with generalized body aches. No vomiting, although he reports that his stomach feels "bubbly". He is still eating and drinking, although with intake his stomach begins to "turn". He has taken any medication for fever since yesterday, and remains afebrile today.   Social History   Social History  . Marital status: Married    Spouse name: N/A  . Number of children: N/A  . Years of education: N/A   Occupational History  . Not on file.   Social History Main Topics  . Smoking status: Never Smoker  . Smokeless tobacco: Never Used  . Alcohol use No  . Drug use: No  . Sexual activity: Not on file   Other Topics Concern  . Not on file   Social History Narrative  . No narrative on file    Family History  Problem Relation Age of Onset  . Heart disease Mother   . Heart disease Father   . Diabetes Father   . Heart failure Mother   . Heart failure Father    Review of Systems  See HPI  Patient Active Problem List   Diagnosis Date Noted  . Nonspecific abnormal electrocardiogram (ECG) (EKG) 12/03/2014  . Hyponatremia 12/03/2014  . Type 2 diabetes mellitus with hyperglycemia (HCC) 08/11/2014    Allergies  Allergen Reactions  . Benadryl [Diphenhydramine Hcl]  Anxiety    Prior to Admission medications   Medication Sig Start Date End Date Taking? Authorizing Provider  aspirin 81 MG chewable tablet Chew 81 mg by mouth daily.   Yes Historical Provider, MD  dapagliflozin propanediol (FARXIGA) 10 MG TABS tablet Take 10 mg by mouth daily. 11/08/15  Yes Romero BellingSean Ellison, MD  glipiZIDE (GLUCOTROL XL) 2.5 MG 24 hr tablet Take 1 tablet (2.5 mg total) by mouth daily with breakfast. 09/05/15  Yes Romero BellingSean Ellison, MD  Glucose Blood (BLOOD GLUCOSE TEST STRIPS) STRP 1 application by In Vitro route 3 (three) times daily as needed. Patient taking differently: 1 application by In Vitro route 3 (three) times daily as needed. One touch verio 08/16/14  Yes Elvina SidleKurt Lauenstein, MD  lisinopril-hydrochlorothiazide (PRINZIDE,ZESTORETIC) 20-12.5 MG tablet TAKE ONE DAILY FOR BLOOD PRESSURE 01/23/16  Yes Romero BellingSean Ellison, MD  metFORMIN (GLUCOPHAGE) 1000 MG tablet TAKE 1 TABLET BY MOUTH TWICE DAILY WITH MEALS 01/23/16  Yes Romero BellingSean Ellison, MD  sildenafil (REVATIO) 20 MG tablet 2-5 pills as needed for ED symptoms 03/08/15  Yes Romero BellingSean Ellison, MD  Dulaglutide (TRULICITY) 1.5 MG/0.5ML SOPN Inject 1.5 mg into the skin once a week. Patient not taking: Reported on 05/30/2016 09/05/15   Romero BellingSean Ellison, MD    Past Medical, Surgical Family and Social History reviewed and updated.  Objective:   Today's Vitals   05/30/16 1339  BP: 122/80  Pulse: (!) 105  Resp: 17  Temp: 98.4 F (36.9 C)  TempSrc: Oral  SpO2: 97%  Weight: 215 lb (97.5 kg)  Height: 5\' 10"  (1.778 m)    Wt Readings from Last 3 Encounters:  05/30/16 215 lb (97.5 kg)  09/05/15 211 lb (95.7 kg)  06/08/15 221 lb (100.2 kg)    Physical Exam  Constitutional: He is oriented to person, place, and time. He appears well-developed and well-nourished.  Appears well hydrated and skin color appropriate for ethnicity  HENT:  Head: Normocephalic and atraumatic.  Nose: Nose normal.  Mouth/Throat: Oropharynx is clear and moist.  Eyes: Pupils are  equal, round, and reactive to light.  Neck: Normal range of motion. Neck supple.  Pulmonary/Chest: Effort normal and breath sounds normal.  Abdominal: Soft. Bowel sounds are decreased.  Mild tenderness noted in mid-epigastric region with deep palpation  Musculoskeletal: Normal range of motion.  Neurological: He is alert and oriented to person, place, and time.  Skin: Skin is warm and dry.  Psychiatric: He has a normal mood and affect. His behavior is normal. Judgment and thought content normal.       Assessment & Plan:  1. Flu-like symptoms - POCT Influenza A/B - POCT CBC  Pt likely has a viral illness that will improve with rest, symptom management, and time.  -Take ranitidine 150 mg twice daily for abdominal upset. -For loose stools, take 1-2 lomotil tablets up to 4 times daily. -Return for care on Saturday if symptoms worsen or haven't resolved.  Godfrey Pick. Tiburcio Pea, MSN, FNP-C Primary Care at [facility] [phone]

## 2016-06-03 ENCOUNTER — Other Ambulatory Visit: Payer: Self-pay | Admitting: Endocrinology

## 2016-06-03 NOTE — Telephone Encounter (Signed)
Please refill x 1 Ov is due  

## 2016-08-19 ENCOUNTER — Other Ambulatory Visit: Payer: Self-pay | Admitting: Endocrinology

## 2016-08-19 NOTE — Telephone Encounter (Signed)
Please refill x 1 Ov is due  

## 2016-08-30 ENCOUNTER — Other Ambulatory Visit: Payer: Self-pay | Admitting: Endocrinology

## 2016-09-05 ENCOUNTER — Ambulatory Visit (INDEPENDENT_AMBULATORY_CARE_PROVIDER_SITE_OTHER): Payer: 59

## 2016-09-05 ENCOUNTER — Ambulatory Visit (INDEPENDENT_AMBULATORY_CARE_PROVIDER_SITE_OTHER): Payer: 59 | Admitting: Emergency Medicine

## 2016-09-05 ENCOUNTER — Encounter: Payer: Self-pay | Admitting: Emergency Medicine

## 2016-09-05 DIAGNOSIS — S161XXA Strain of muscle, fascia and tendon at neck level, initial encounter: Secondary | ICD-10-CM | POA: Diagnosis not present

## 2016-09-05 DIAGNOSIS — S29012A Strain of muscle and tendon of back wall of thorax, initial encounter: Secondary | ICD-10-CM | POA: Diagnosis not present

## 2016-09-05 MED ORDER — CYCLOBENZAPRINE HCL 10 MG PO TABS: 10.0000 mg | ORAL_TABLET | Freq: Three times a day (TID) | ORAL | 0 refills | Status: DC | PRN

## 2016-09-05 MED ORDER — DICLOFENAC SODIUM 75 MG PO TBEC: 75.0000 mg | DELAYED_RELEASE_TABLET | Freq: Two times a day (BID) | ORAL | 0 refills | Status: DC
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
## 2016-09-07 ENCOUNTER — Ambulatory Visit (INDEPENDENT_AMBULATORY_CARE_PROVIDER_SITE_OTHER): Payer: 59 | Admitting: Physician Assistant

## 2016-09-07 ENCOUNTER — Encounter: Payer: Self-pay | Admitting: Physician Assistant

## 2016-09-07 VITALS — BP 120/75 | HR 94 | Temp 98.4°F | Resp 16 | Ht 70.0 in | Wt 213.4 lb

## 2016-09-07 DIAGNOSIS — M542 Cervicalgia: Secondary | ICD-10-CM | POA: Diagnosis not present

## 2016-09-07 DIAGNOSIS — F341 Dysthymic disorder: Secondary | ICD-10-CM

## 2016-09-07 MED ORDER — SERTRALINE HCL 50 MG PO TABS: 50.0000 mg | ORAL_TABLET | Freq: Every day | ORAL | 1 refills | Status: DC

## 2016-09-07 NOTE — Patient Instructions (Signed)
     IF you received an x-ray today, you will receive an invoice from [facility]. Please contact [facility] at [phone] with questions or concerns regarding your invoice.   IF you received labwork today, you will receive an invoice from [facility]. Please contact [facility] at [phone] with questions or concerns regarding your invoice.   Our billing staff will not be able to assist you with questions regarding bills from these companies.  You will be contacted with the lab results as soon as they are available. The fastest way to get your results is to activate your My Chart account. Instructions are located on the last page of this paperwork. If you have not heard from us regarding the results in 2 weeks, please contact this office.     
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
## 2016-09-10 ENCOUNTER — Telehealth: Payer: Self-pay | Admitting: Emergency Medicine

## 2016-09-10 NOTE — Telephone Encounter (Signed)
[facility] called requesting prescription for Physical Therapy be sent to them with referral. Please advise. Thanks!

## 2016-09-10 NOTE — Telephone Encounter (Signed)
It is in.

## 2016-09-18 ENCOUNTER — Other Ambulatory Visit: Payer: Self-pay | Admitting: Emergency Medicine

## 2016-09-19 NOTE — Telephone Encounter (Signed)
09/05/16 last refill 

## 2016-09-28 ENCOUNTER — Ambulatory Visit: Payer: 59 | Admitting: Physician Assistant

## 2016-10-22 ENCOUNTER — Other Ambulatory Visit: Payer: Self-pay | Admitting: Endocrinology

## 2016-10-22 DIAGNOSIS — I10 Essential (primary) hypertension: Secondary | ICD-10-CM

## 2016-10-22 NOTE — Telephone Encounter (Signed)
I only see pt for DM, so he would need to ask a PCP about this.   F/u ov is due

## 2016-10-22 NOTE — Telephone Encounter (Signed)
Okay to refill last seen 08/2015, no other appointments made.  Thank you!

## 2016-10-29 ENCOUNTER — Ambulatory Visit (INDEPENDENT_AMBULATORY_CARE_PROVIDER_SITE_OTHER): Payer: 59 | Admitting: Physician Assistant

## 2016-10-29 ENCOUNTER — Encounter: Payer: Self-pay | Admitting: Physician Assistant

## 2016-10-29 VITALS — BP 130/84 | HR 88 | Temp 98.4°F | Resp 18 | Ht 70.0 in | Wt 215.8 lb

## 2016-10-29 DIAGNOSIS — F341 Dysthymic disorder: Secondary | ICD-10-CM

## 2016-10-29 DIAGNOSIS — E119 Type 2 diabetes mellitus without complications: Secondary | ICD-10-CM

## 2016-10-29 DIAGNOSIS — I10 Essential (primary) hypertension: Secondary | ICD-10-CM

## 2016-10-29 LAB — CMP14+EGFR
A/G RATIO: 1.5 (ref 1.2–2.2)
ALT: 14 IU/L (ref 0–44)
AST: 8 IU/L (ref 0–40)
Albumin: 4.4 g/dL (ref 3.5–5.5)
Alkaline Phosphatase: 35 IU/L — ABNORMAL LOW (ref 39–117)
BILIRUBIN TOTAL: 0.7 mg/dL (ref 0.0–1.2)
BUN/Creatinine Ratio: 16 (ref 9–20)
BUN: 17 mg/dL (ref 6–24)
CHLORIDE: 95 mmol/L — AB (ref 96–106)
CO2: 24 mmol/L (ref 20–29)
Calcium: 10.3 mg/dL — ABNORMAL HIGH (ref 8.7–10.2)
Creatinine, Ser: 1.04 mg/dL (ref 0.76–1.27)
GFR calc Af Amer: 93 mL/min/{1.73_m2} (ref >59)
GFR calc non Af Amer: 80 mL/min/{1.73_m2} (ref >59)
GLOBULIN, TOTAL: 2.9 g/dL (ref 1.5–4.5)
Glucose: 272 mg/dL — ABNORMAL HIGH (ref 65–99)
POTASSIUM: 4.9 mmol/L (ref 3.5–5.2)
SODIUM: 135 mmol/L (ref 134–144)
Total Protein: 7.3 g/dL (ref 6.0–8.5)

## 2016-10-29 LAB — HEMOGLOBIN A1C
ESTIMATED AVERAGE GLUCOSE: 315 mg/dL
HEMOGLOBIN A1C: 12.6 % — AB (ref 4.8–5.6)

## 2016-10-29 MED ORDER — LISINOPRIL-HYDROCHLOROTHIAZIDE 20-12.5 MG PO TABS: ORAL_TABLET | ORAL | 1 refills | Status: DC

## 2016-10-29 MED ORDER — DAPAGLIFLOZIN PROPANEDIOL 10 MG PO TABS: ORAL_TABLET | ORAL | 1 refills | Status: DC

## 2016-10-29 MED ORDER — GLIPIZIDE ER 2.5 MG PO TB24: 2.5000 mg | ORAL_TABLET | Freq: Every day | ORAL | 1 refills | Status: DC

## 2016-10-29 MED ORDER — METFORMIN HCL 1000 MG PO TABS: 1000.0000 mg | ORAL_TABLET | Freq: Two times a day (BID) | ORAL | 1 refills | Status: DC
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
## 2016-10-29 NOTE — Patient Instructions (Signed)
I will have your labs results shortly Please follow up with the lipid panel, by stopping by for labs only. Diabetes Mellitus and Food It is important for you to manage your blood sugar (glucose) level. Your blood glucose level can be greatly affected by what you eat. Eating healthier foods in the appropriate amounts throughout the day at about the same time each day will help you control your blood glucose level. It can also help slow or prevent worsening of your diabetes mellitus. Healthy eating may even help you improve the level of your blood pressure and reach or maintain a healthy weight. General recommendations for healthful eating and cooking habits include:  Eating meals and snacks regularly. Avoid going long periods of time without eating to lose weight.  Eating a diet that consists mainly of plant-based foods, such as fruits, vegetables, nuts, legumes, and whole grains.  Using low-heat cooking methods, such as baking, instead of high-heat cooking methods, such as deep frying.  Work with your dietitian to make sure you understand how to use the Nutrition Facts information on food labels. How can food affect me? Carbohydrates Carbohydrates affect your blood glucose level more than any other type of food. Your dietitian will help you determine how many carbohydrates to eat at each meal and teach you how to count carbohydrates. Counting carbohydrates is important to keep your blood glucose at a healthy level, especially if you are using insulin or taking certain medicines for diabetes mellitus. Alcohol Alcohol can cause sudden decreases in blood glucose (hypoglycemia), especially if you use insulin or take certain medicines for diabetes mellitus. Hypoglycemia can be a life-threatening condition. Symptoms of hypoglycemia (sleepiness, dizziness, and disorientation) are similar to symptoms of having too much alcohol. If your health care provider has given you approval to drink alcohol, do so in  moderation and use the following guidelines:  Women should not have more than one drink per day, and men should not have more than two drinks per day. One drink is equal to: ? 12 oz of beer. ? 5 oz of wine. ? 1 oz of hard liquor.  Do not drink on an empty stomach.  Keep yourself hydrated. Have water, diet soda, or unsweetened iced tea.  Regular soda, juice, and other mixers might contain a lot of carbohydrates and should be counted.  What foods are not recommended? As you make food choices, it is important to remember that all foods are not the same. Some foods have fewer nutrients per serving than other foods, even though they might have the same number of calories or carbohydrates. It is difficult to get your body what it needs when you eat foods with fewer nutrients. Examples of foods that you should avoid that are high in calories and carbohydrates but low in nutrients include:  Trans fats (most processed foods list trans fats on the Nutrition Facts label).  Regular soda.  Juice.  Candy.  Sweets, such as cake, pie, doughnuts, and cookies.  Fried foods.  What foods can I eat? Eat nutrient-rich foods, which will nourish your body and keep you healthy. The food you should eat also will depend on several factors, including:  The calories you need.  The medicines you take.  Your weight.  Your blood glucose level.  Your blood pressure level.  Your cholesterol level.  You should eat a variety of foods, including:  Protein. ? Lean cuts of meat. ? Proteins low in saturated fats, such as fish, egg whites, and beans. Avoid  processed meats.  Fruits and vegetables. ? Fruits and vegetables that may help control blood glucose levels, such as apples, mangoes, and yams.  Dairy products. ? Choose fat-free or low-fat dairy products, such as milk, yogurt, and cheese.  Grains, bread, pasta, and rice. ? Choose whole grain products, such as multigrain bread, whole oats, and  brown rice. These foods may help control blood pressure.  Fats. ? Foods containing healthful fats, such as nuts, avocado, olive oil, canola oil, and fish.  Does everyone with diabetes mellitus have the same meal plan? Because every person with diabetes mellitus is different, there is not one meal plan that works for everyone. It is very important that you meet with a dietitian who will help you create a meal plan that is just right for you. This information is not intended to replace advice given to you by your health care provider. Make sure you discuss any questions you have with your health care provider. Document Released: 01/04/2005 Document Revised: 09/15/2015 Document Reviewed: 03/06/2013 Elsevier Interactive Patient Education  2017 [employer].

## 2016-12-23 ENCOUNTER — Other Ambulatory Visit: Payer: Self-pay | Admitting: Endocrinology

## 2016-12-26 ENCOUNTER — Other Ambulatory Visit: Payer: Self-pay | Admitting: Endocrinology

## 2016-12-28 ENCOUNTER — Other Ambulatory Visit: Payer: Self-pay | Admitting: Endocrinology

## 2016-12-29 ENCOUNTER — Telehealth: Payer: Self-pay | Admitting: Physician Assistant

## 2016-12-29 NOTE — Telephone Encounter (Signed)
Please advise that he needs to follow up with us in 1 month.  I need to recheck a1c now that metformin is on board.  Do this here or with Dr. Everardo AllEllison, of endocrinology.

## 2016-12-31 NOTE — Telephone Encounter (Signed)
Spoke with patient, he understood to follow up and why he needed too.

## 2017-03-23 ENCOUNTER — Ambulatory Visit (HOSPITAL_COMMUNITY)
Admission: EM | Admit: 2017-03-23 | Discharge: 2017-03-23 | Disposition: A | Discharge status: Home / Self Care | Payer: 59 | Attending: Physician Assistant | Admitting: Physician Assistant

## 2017-03-23 ENCOUNTER — Encounter (HOSPITAL_COMMUNITY): Payer: Self-pay | Admitting: Family Medicine

## 2017-03-23 DIAGNOSIS — L03031 Cellulitis of right toe: Secondary | ICD-10-CM

## 2017-03-23 MED ORDER — CEPHALEXIN 750 MG PO CAPS: 750.0000 mg | ORAL_CAPSULE | Freq: Two times a day (BID) | ORAL | 0 refills | Status: DC

## 2017-03-23 NOTE — ED Triage Notes (Signed)
Pt here for right big toe pain and swelling reports that he pulled a hang nail earlier this week. Pt is a diabetic.

## 2017-03-23 NOTE — ED Provider Notes (Addendum)
03/23/2017 6:51 PM   DOB: 02-13-1961 / MRN: [id number]  SUBJECTIVE:  Zachary HumphreysJoel Walsh is a 56 y.o. male presenting for right medial toe pain that started after removing a hang nail three days ago.  Feels that the pain is getting worse. Has tried OTC pain meds with minimal relief.   He is allergic to benadryl [diphenhydramine hcl].   He  has a past medical history of Diabetes mellitus, Hypertension, and Seasonal allergies.    He  reports that  has never smoked. he has never used smokeless tobacco. He reports that he does not drink alcohol or use drugs. He  has no sexual activity history on file. The patient  has a past surgical history that includes right leg fracture and Hernia repair.  His family history includes Diabetes in his father; Heart disease in his father and mother; Heart failure in his father and mother.  Review of Systems  Constitutional: Negative for fever.  Gastrointestinal: Negative for nausea.  Neurological: Negative for dizziness.    OBJECTIVE:  BP 137/85   Pulse 82   Temp 98.2 F (36.8 C)   Resp 18   SpO2 96%   Physical Exam  Constitutional: He is oriented to person, place, and time.  Cardiovascular: Normal rate.  Pulmonary/Chest: Effort normal.  Musculoskeletal: Normal range of motion.       Feet:  Neurological: He is alert and oriented to person, place, and time.   Risk and benefits discussed and verbal consent obtained. Anesthetic allergies reviewed. Patient anesthetized using 1:1 mix of 2% lidocaine without epi. A 1 cm incision was made using a number 11 blade and purulent material was expressed.  The was not wound packed. The patient tolerated the procedure without difficulty.   A clean dressing was placed and wound care instructions were provided.    No results found for this or any previous visit (from the past 72 hour(s)).  No results found.  ASSESSMENT AND PLAN:  The encounter diagnosis was Paronychia of great toe of right foot. Abscess drained  here.  Starting kelfex for broad coverage. RTC as needed.     The patient is advised to call or return to clinic if he does not see an improvement in symptoms, or to seek the care of the closest emergency department if he worsens with the above plan.   Zachary Walsh , MHS, PA-C 03/23/2017 6:51 PM    Zachary Walsh,  L, PA-C 03/23/17 1853    Zachary Walsh,  L, PA-C 03/23/17 [id number]

## 2017-03-23 NOTE — Discharge Instructions (Signed)
Use a warm soak tonight and tomorrow when possible.

## 2017-04-26 ENCOUNTER — Other Ambulatory Visit: Payer: Self-pay | Admitting: Physician Assistant

## 2017-04-26 DIAGNOSIS — I10 Essential (primary) hypertension: Secondary | ICD-10-CM

## 2017-05-01 ENCOUNTER — Encounter: Payer: Self-pay | Admitting: Family Medicine

## 2017-05-01 ENCOUNTER — Ambulatory Visit: Payer: Self-pay | Admitting: Physician Assistant

## 2017-05-01 ENCOUNTER — Ambulatory Visit (INDEPENDENT_AMBULATORY_CARE_PROVIDER_SITE_OTHER): Payer: 59 | Admitting: Family Medicine

## 2017-05-01 VITALS — BP 133/82 | HR 82 | Temp 98.9°F | Resp 24 | Ht 70.0 in | Wt 212.0 lb

## 2017-05-01 DIAGNOSIS — R0789 Other chest pain: Secondary | ICD-10-CM

## 2017-05-01 DIAGNOSIS — E1165 Type 2 diabetes mellitus with hyperglycemia: Secondary | ICD-10-CM | POA: Diagnosis not present

## 2017-05-01 DIAGNOSIS — I208 Other forms of angina pectoris: Secondary | ICD-10-CM | POA: Diagnosis not present

## 2017-05-01 LAB — POCT CBC
Granulocyte percent: 50.6 %G (ref 37–80)
HCT, POC: 44.2 % (ref 43.5–53.7)
Hemoglobin: 14.8 g/dL (ref 14.1–18.1)
Lymph, poc: 1.9 (ref 0.6–3.4)
MCH, POC: 31 pg (ref 27–31.2)
MCHC: 33.5 g/dL (ref 31.8–35.4)
MCV: 92.6 fL (ref 80–97)
MID (CBC): 0.6 (ref 0–0.9)
MPV: 9.3 fL (ref 0–99.8)
POC Granulocyte: 2.5 (ref 2–6.9)
POC LYMPH %: 38.2 % (ref 10–50)
POC MID %: 11.2 % (ref 0–12)
Platelet Count, POC: 223 10*3/uL (ref 142–424)
RBC: 4.77 M/uL (ref 4.69–6.13)
RDW, POC: 13 %
WBC: 5 10*3/uL (ref 4.6–10.2)

## 2017-05-01 LAB — POCT GLYCOSYLATED HEMOGLOBIN (HGB A1C): Hemoglobin A1C: 13.1

## 2017-05-01 LAB — GLUCOSE, POCT (MANUAL RESULT ENTRY): POC Glucose: 245 mg/dl — AB (ref 70–99)

## 2017-05-01 MED ORDER — METOPROLOL SUCCINATE ER 50 MG PO TB24: 50.0000 mg | ORAL_TABLET | Freq: Every day | ORAL | 0 refills | Status: DC

## 2017-05-01 MED ORDER — NITROGLYCERIN 0.4 MG SL SUBL: 0.4000 mg | SUBLINGUAL_TABLET | SUBLINGUAL | 0 refills | Status: DC | PRN

## 2017-05-01 MED ORDER — ASPIRIN 81 MG PO CHEW
324.0000 mg | CHEWABLE_TABLET | Freq: Once | ORAL | Status: AC
  Administered 2017-05-01: 324 mg via ORAL

## 2017-05-01 NOTE — Progress Notes (Signed)
Chief Complaint  Patient presents with  . Chest Pain    onset 30-40 minutes ago at rest, pain under chest, fluttering heart rate sometimes. No other current symptoms.     HPI  This is a 56yo patient with chest pain at rest at work for an hour that has now resolved.  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       Past Medical History:  Diagnosis Date  . Diabetes mellitus   . Hypertension   . Seasonal allergies     Current Outpatient Medications  Medication Sig Dispense Refill  . dapagliflozin propanediol (FARXIGA) 10 MG TABS tablet TAKE 1 TABLET (10 MG) BY MOUTH DAILY. 90 tablet 1  . glipiZIDE (GLUCOTROL XL) 2.5 MG 24 hr tablet Take 1 tablet (2.5 mg total) by mouth daily with breakfast. 90 tablet 1  . lisinopril-hydrochlorothiazide (PRINZIDE,ZESTORETIC) 20-12.5 MG tablet TAKE ONE DAILY FOR BLOOD PRESSURE 30 tablet 0  . metFORMIN (GLUCOPHAGE) 1000 MG tablet Take 1 tablet (1,000 mg total) by mouth 2 (two) times daily with a meal. 180 tablet 1  . aspirin 81 MG chewable tablet Chew 81 mg by mouth daily.    . cephALEXin (KEFLEX) 750 MG capsule Take 1 capsule (750 mg total) by mouth 2 (two) times daily. (Patient not taking: Reported on 05/01/2017) 20 capsule 0  . diphenoxylate-atropine (LOMOTIL) 2.5-0.025 MG tablet Take 1-2 tablets by mouth 4 (four) times daily as needed for diarrhea or loose stools. (Patient not taking: Reported on 10/29/2016) 30 tablet 0  . glipiZIDE (GLUCOTROL XL)  2.5 MG 24 hr tablet TAKE 1 TABLET BY MOUTH DAILY WITH BREAKFAST**APPOINTMENT NEEDED FOR FURTHER REFILLS** 90 tablet 0  . metoprolol succinate (TOPROL-XL) 50 MG 24 hr tablet Take 1 tablet (50 mg total) by mouth daily. Take with or immediately following a meal. 30 tablet 0  . nitroGLYCERIN (NITROSTAT) 0.4 MG SL tablet Place 1 tablet (0.4 mg total) under the tongue every 5 (five) minutes as needed for chest pain. 10 tablet 0   No current facility-administered medications for this visit.     Allergies:  Allergies  Allergen Reactions  . Benadryl [Diphenhydramine Hcl] Anxiety    Past Surgical History:  Procedure Laterality Date  . HERNIA REPAIR    . right leg fracture      Social History   Socioeconomic History  . Marital status: Married    Spouse name: Not on file  . Number of children: Not on file  . Years of education: Not on file  . Highest education level: Not on file  Social Needs  . Financial resource strain: Not on file  . Food insecurity - worry: Not on file  . Food insecurity - inability: Not on file  . Transportation needs - medical: Not on file  . Transportation needs - non-medical: Not on file  Occupational History  . Not on file  Tobacco Use  . Smoking status: Never  Smoker  . Smokeless tobacco: Never Used  Substance and Sexual Activity  . Alcohol use: No  . Drug use: No  . Sexual activity: Not on file  Other Topics Concern  . Not on file  Social History Narrative  . Not on file    Family History  Problem Relation Age of Onset  . Heart disease Mother   . Heart failure Mother   . Heart disease Father   . Diabetes Father   . Heart failure Father      Review of Systems  Constitutional: Negative for chills, fever, malaise/fatigue and weight loss.  HENT: Negative for ear discharge, ear pain, hearing loss and tinnitus.   Eyes: Negative for blurred vision and double vision.  Respiratory: Negative for cough, shortness of breath and wheezing.     Cardiovascular: Positive for chest pain. Negative for palpitations, orthopnea and claudication.  Gastrointestinal: Negative for abdominal pain, nausea and vomiting.  Genitourinary: Negative for dysuria and urgency.  Musculoskeletal: Positive for back pain. Negative for joint pain and myalgias.  Skin: Negative for itching and rash.  Neurological: Negative for dizziness, tingling, tremors, loss of consciousness and headaches.  Psychiatric/Behavioral: Negative for depression. The patient is not nervous/anxious.       Objective: Vitals:   05/01/17 1030  BP: 133/82  Pulse: 82  Resp: (!) 24  Temp: 98.9 F (37.2 C)  SpO2: 98%  Weight: 212 lb (96.2 kg)  Height: 5\' 10"  (1.778 m)    Physical Exam  Constitutional: He is oriented to person, place, and time. He appears well-developed and well-nourished.  HENT:  Head: Normocephalic and atraumatic.  Neck: Normal range of motion. Neck supple.  No carotid bruit  Cardiovascular: Normal rate, regular rhythm and normal pulses. Exam reveals no S3.  No murmur heard.  No diastolic murmur is present. Pulmonary/Chest: Effort normal and breath sounds normal. He has no decreased breath sounds. He has no wheezes. He has no rhonchi. He has no rales. He exhibits no mass, no tenderness, no laceration and no crepitus.    Abdominal: Soft. Bowel sounds are normal. He exhibits no distension, no ascites and no mass. There is no tenderness. There is no rebound and no guarding.  Musculoskeletal: Normal range of motion.       Right lower leg: Normal.       Left lower leg: Normal.  Neurological: He is alert and oriented to person, place, and time.  Skin: Skin is warm. Capillary refill takes less than 2 seconds. No erythema.  Psychiatric: He has a normal mood and affect. His behavior is normal.   ECG interpretation ECG unchanged compared to 2016 NSR, no ST   Assessment and Plan Zachary Walsh was seen today for chest pain.  Diagnoses and all orders for this  visit:  Other chest pain -     EKG 12-Lead -     POCT CBC -     Ambulatory referral to Cardiology  Angina at rest [facility]) -     Cancel: ECHOCARDIOGRAM STRESS TEST; Future -     aspirin chewable tablet 324 mg -     POCT CBC -     POCT glycosylated hemoglobin (Hb A1C) -     POCT glucose (manual entry) -     Ambulatory referral to Cardiology  Uncontrolled type 2 diabetes mellitus with hyperglycemia (HCC) -     POCT CBC -     POCT glycosylated hemoglobin (Hb A1C) -     POCT glucose (manual entry)  Other  orders -     metoprolol succinate (TOPROL-XL) 50 MG 24 hr tablet; Take 1 tablet (50 mg total) by mouth daily. Take with or immediately following a meal. -     nitroGLYCERIN (NITROSTAT) 0.4 MG SL tablet; Place 1 tablet (0.4 mg total) under the tongue every 5 (five) minutes as needed for chest pain.   Discussed case with Dr. Jacinto HalimGanji Pt with unstable angina He should go to Cardiology to get troponins He should pick up his metoprolol and nitro He was given 324mg  asa in the office Since his ECG is unchanged compared to 2016 and his pain resolved without intervention then he should follow up with the ER He should be stressed since he has hypertension, uncontrolled diabetes as well as sedentary job in a 56yo [employer] Dr. Jacinto HalimGanji will take over management Patient instructed on ER precautions and verbalized understanding Time spent reviewing ECG, taking history with exam, discussing patient with Cardiology and patient instruction was 45 minutes    Zachary Walsh

## 2017-05-01 NOTE — Patient Instructions (Addendum)
Please go to Dr. Verl Dicker office right now for a STAT Troponin blood draw.  [address], [place], [place] [address]  Pick up Nitroglycerin and metoprolol at your pharmacy after your blood draw.   Tomorrow you will go to Dr. Verl Dicker office for a stress test, they will confirm the time with you at your blood draw today.  Go to the ER for any chest pain  Take the nitroglycerin on the way    IF you received an x-ray today, you will receive an invoice from [facility]. Please contact [facility] at [phone] with questions or concerns regarding your invoice.   IF you received labwork today, you will receive an invoice from [place]. Please contact [facility] at [phone] with questions or concerns regarding your invoice.   Our billing staff will not be able to assist you with questions regarding bills from these companies.  You will be contacted with the lab results as soon as they are available. The fastest way to get your results is to activate your My Chart account. Instructions are located on the last page of this paperwork. If you have not heard from [place] regarding the results in 2 weeks, please contact this office.      Angina Pectoris Angina pectoris, often called angina, is extreme discomfort in the chest, neck, or arm. This is caused by a lack of blood in the middle and thickest layer of the heart wall (myocardium). There are four types of angina:  Stable angina. Stable angina usually occurs in episodes of predictable frequency and duration. It is usually brought on by physical activity, stress, or excitement. Stable angina usually lasts a few minutes and can often be relieved by a medicine that you place under your tongue. This medicine is called sublingual nitroglycerin.  Unstable angina. Unstable angina can occur even when you are doing little or no physical activity. It can even occur while you are sleeping or when you are at rest. It can suddenly increase  in severity or frequency. It may not be relieved by sublingual nitroglycerin, and it can last up to 30 minutes.  Microvascular angina. This type of angina is caused by a disorder of tiny blood vessels called arterioles. Microvascular angina is more common in women. The pain may be more severe and last longer than other types of angina pectoris.  Prinzmetal or variant angina. This type of angina pectoris is rare and usually occurs when you are doing little or no physical activity. It especially occurs in the early morning hours.  What are the causes? Atherosclerosis is the cause of angina. This is the buildup of fat and cholesterol (plaque) on the inside of the arteries. Over time, the plaque may narrow or block the artery, and this will lessen blood flow to the heart. Plaque can also become weak and break off within a coronary artery to form a clot and cause a sudden blockage. What increases the risk? Risk factors common to both men and women include:  High cholesterol levels.  High blood pressure (hypertension).  Tobacco use.  Diabetes.  Family history of angina.  Obesity.  Lack of exercise.  A diet high in saturated fats.  Women are at greater risk for angina if they are:  Over age 8.  Postmenopausal.  What are the signs or symptoms? Many people do not experience any symptoms during the early stages of angina. As the condition progresses, symptoms common to both men and women may include:  Chest pain. ?  The pain can be described as a crushing or squeezing in the chest, or a tightness, pressure, fullness, or heaviness in the chest. ? The pain can last more than a few minutes, or it can stop and recur.  Pain in the arms, neck, jaw, or back.  Unexplained heartburn or indigestion.  Shortness of breath.  Nausea.  Sudden cold sweats.  Sudden light-headedness.  Many women have chest discomfort and some of the other symptoms. However, women often have different  (atypical) symptoms, such as:  Fatigue.  Unexplained feelings of nervousness or anxiety.  Unexplained weakness.  Dizziness or fainting.  Sometimes, women may have angina without any symptoms. How is this diagnosed? Tests to diagnose angina may include:  ECG (electrocardiogram).  Exercise stress test. This looks for signs of blockage when the heart is being exercised.  Pharmacologic stress test. This test looks for signs of blockage when the heart is being stressed with a medicine.  Blood tests.  Coronary angiogram. This is a procedure to look at the coronary arteries to see if there is any blockage.  How is this treated? The treatment of angina may include the following:  Healthy behavioral changes to reduce or control risk factors.  Medicine.  Coronary stenting.A stent helps to keep an artery open.  Coronary angioplasty. This procedure widens a narrowed or blocked artery.  Coronary arterybypass surgery. This will allow your blood to pass the blockage (bypass) to reach your heart.  Follow these instructions at home:  Take medicines only as directed by your health care provider.  Do not take the following medicines unless your health care provider approves: ? Nonsteroidal anti-inflammatory drugs (NSAIDs), such as ibuprofen, naproxen, or celecoxib. ? Vitamin supplements that contain vitamin A, vitamin E, or both. ? Hormone replacement therapy that contains estrogen with or without progestin.  Manage other health conditions such as hypertension and diabetes as directed by your health care provider.  Follow a heart-healthy diet. A dietitian can help to educate you about healthy food options and changes.  Use healthy cooking methods such as roasting, grilling, broiling, baking, poaching, steaming, or stir-frying. Talk to a dietitian to learn more about healthy cooking methods.  Follow an exercise program approved by your health care provider.  Maintain a healthy  weight. Lose weight as approved by your health care provider.  Plan rest periods when fatigued.  Learn to manage stress.  Do not use any tobacco products, including cigarettes, chewing tobacco, or electronic cigarettes. If you need help quitting, ask your health care provider.  If you drink alcohol, and your health care provider approves, limit your alcohol intake to no more than 1 drink per day. One drink equals 12 ounces of beer, 5 ounces of wine, or 1 ounces of hard liquor.  Stop illegal drug use.  Keep all follow-up visits as directed by your health care provider. This is important. Get help right away if:  You have pain in your chest, neck, arm, jaw, stomach, or back that lasts more than a few minutes, is recurring, or is unrelieved by taking sublingualnitroglycerin.  You have profuse sweating without cause.  You have unexplained: ? Heartburn or indigestion. ? Shortness of breath or difficulty breathing. ? Nausea or vomiting. ? Fatigue. ? Feelings of nervousness or anxiety. ? Weakness. ? Diarrhea.  You have sudden light-headedness or dizziness.  You faint. These symptoms may represent a serious problem that is an emergency. Do not wait to see if the symptoms will go away. Get medical help  right away. Call your local emergency services (911 in the [place] Do not drive yourself to the hospital. This information is not intended to replace advice given to you by your health care provider. Make sure you discuss any questions you have with your health care provider. Document Released: 04/09/2005 Document Revised: 09/21/2015 Document Reviewed: 08/11/2013 Elsevier Interactive Patient Education  2017 [employer].

## 2017-05-09 ENCOUNTER — Other Ambulatory Visit: Payer: Self-pay | Admitting: Physician Assistant

## 2017-05-09 DIAGNOSIS — F341 Dysthymic disorder: Secondary | ICD-10-CM

## 2017-05-09 NOTE — Telephone Encounter (Signed)
Do not see where she is on this medication  Zoloft

## 2017-05-16 ENCOUNTER — Encounter: Payer: Self-pay | Admitting: Family Medicine

## 2017-05-16 DIAGNOSIS — R0789 Other chest pain: Secondary | ICD-10-CM | POA: Insufficient documentation

## 2017-05-22 ENCOUNTER — Other Ambulatory Visit: Payer: Self-pay | Admitting: Physician Assistant

## 2017-05-22 DIAGNOSIS — E119 Type 2 diabetes mellitus without complications: Secondary | ICD-10-CM

## 2017-05-23 ENCOUNTER — Ambulatory Visit (INDEPENDENT_AMBULATORY_CARE_PROVIDER_SITE_OTHER): Payer: 59 | Admitting: Family Medicine

## 2017-05-23 ENCOUNTER — Other Ambulatory Visit: Payer: Self-pay

## 2017-05-23 ENCOUNTER — Encounter: Payer: Self-pay | Admitting: Family Medicine

## 2017-05-23 VITALS — BP 120/70 | HR 88 | Temp 97.9°F | Resp 16 | Ht 70.0 in | Wt 221.2 lb

## 2017-05-23 DIAGNOSIS — N5201 Erectile dysfunction due to arterial insufficiency: Secondary | ICD-10-CM | POA: Diagnosis not present

## 2017-05-23 DIAGNOSIS — I10 Essential (primary) hypertension: Secondary | ICD-10-CM | POA: Diagnosis not present

## 2017-05-23 DIAGNOSIS — Z125 Encounter for screening for malignant neoplasm of prostate: Secondary | ICD-10-CM | POA: Diagnosis not present

## 2017-05-23 DIAGNOSIS — Z23 Encounter for immunization: Secondary | ICD-10-CM

## 2017-05-23 DIAGNOSIS — E1165 Type 2 diabetes mellitus with hyperglycemia: Secondary | ICD-10-CM | POA: Diagnosis not present

## 2017-05-23 DIAGNOSIS — F341 Dysthymic disorder: Secondary | ICD-10-CM | POA: Diagnosis not present

## 2017-05-23 MED ORDER — FREESTYLE LIBRE 14 DAY READER DEVI: 1.0000 | Freq: Three times a day (TID) | 0 refills | Status: AC

## 2017-05-23 MED ORDER — METOPROLOL SUCCINATE ER 50 MG PO TB24: 50.0000 mg | ORAL_TABLET | Freq: Every day | ORAL | 6 refills | Status: DC

## 2017-05-23 MED ORDER — LISINOPRIL-HYDROCHLOROTHIAZIDE 20-12.5 MG PO TABS: ORAL_TABLET | ORAL | 6 refills | Status: DC

## 2017-05-23 MED ORDER — FREESTYLE LIBRE 14 DAY SENSOR MISC: 1.0000 | Freq: Three times a day (TID) | 6 refills | Status: AC

## 2017-05-23 NOTE — Patient Instructions (Addendum)
cialis or tadalafil  Ask your insurance how much they would cover for 5 or 10 mg    IF you received an x-ray today, you will receive an [facility]. Please contact [facility] at [phone] with questions or concerns regarding your invoice.   IF you received labwork today, you will receive an [facility] from [facility]. Please contact [facility] at [phone] with questions or concerns regarding your invoice.   Our billing staff will not be able to assist you with questions regarding bills from these companies.  You will be contacted with the lab results as soon as they are available. The fastest way to get your results is to activate your My Chart account. Instructions are located on the last page of this paperwork. If you have not heard from us regarding the results in 2 weeks, please contact this office.     Erectile Dysfunction Erectile dysfunction (ED) is the inability to get or keep an erection in order to have sexual intercourse. Erectile dysfunction may include:  Inability to get an erection.  Lack of enough hardness of the erection to allow penetration.  Loss of the erection before sex is finished.  What are the causes? This condition may be caused by:  Certain medicines, such as: ? Pain relievers. ? Antihistamines. ? Antidepressants. ? Blood pressure medicines. ? Water pills (diuretics). ? Ulcer medicines. ? Muscle relaxants. ? Drugs.  Excessive drinking.  Psychological causes, such as: ? Anxiety. ? Depression. ? Sadness. ? Exhaustion. ? Performance fear. ? Stress.  Physical causes, such as: ? Artery problems. This may include diabetes, smoking, liver disease, or atherosclerosis. ? High blood pressure. ? Hormonal problems, such as low testosterone. ? Obesity. ? Nerve problems. This may include back or pelvic injuries, diabetes mellitus, multiple sclerosis, or Parkinson disease.  What are the signs or symptoms? Symptoms of  this condition include:  Inability to get an erection.  Lack of enough hardness of the erection to allow penetration.  Loss of the erection before sex is finished.  Normal erections at some times, but with frequent unsatisfactory episodes.  Low sexual satisfaction in either partner due to erection problems.  A curved penis occurring with erection. The curve may cause pain or the penis may be too curved to allow for intercourse.  Never having nighttime erections.  How is this diagnosed? This condition is often diagnosed by:  Performing a physical exam to find other diseases or specific problems with the penis.  Asking you detailed questions about the problem.  Performing blood tests to check for diabetes mellitus or to measure hormone levels.  Performing other tests to check for underlying health conditions.  Performing an ultrasound exam to check for scarring.  Performing a test to check blood flow to the penis.  Doing a sleep study at home to measure nighttime erections.  How is this treated? This condition may be treated by:  Medicine taken by mouth to help you achieve an erection (oral medicine).  Hormone replacement therapy to replace low testosterone levels.  Medicine that is injected into the penis. Your health care provider may instruct you how to give yourself these injections at home.  Vacuum pump. This is a pump with a ring on it. The pump and ring are placed on the penis and used to create pressure that helps the penis become erect.  Penile implant surgery. In this procedure, you may receive: ? An inflatable implant. This consists of cylinders, a pump, and a reservoir. The cylinders  can be inflated with a fluid that helps to create an erection, and they can be deflated after intercourse. ? A semi-rigid implant. This consists of two silicone rubber rods. The rods provide some rigidity. They are also flexible, so the penis can both curve downward in its normal  position and become straight for sexual intercourse.  Blood vessel surgery, to improve blood flow to the penis. During this procedure, a blood vessel from a different part of the body is placed into the penis to allow blood to flow around (bypass) damaged or blocked blood vessels.  Lifestyle changes, such as exercising more, losing weight, and quitting smoking.  Follow these instructions at home: Medicines  Take over-the-counter and prescription medicines only as told by your health care provider. Do not increase the dosage without first discussing it with your health care provider.  If you are using self-injections, perform injections as directed by your health care provider. Make sure to avoid any veins that are on the surface of the penis. After giving an injection, apply pressure to the injection site for 5 minutes. General instructions  Exercise regularly, as directed by your health care provider. Work with your health care provider to lose weight, if needed.  Do not use any products that contain nicotine or tobacco, such as cigarettes and e-cigarettes. If you need help quitting, ask your health care provider.  Before using a vacuum pump, read the instructions that come with the pump and discuss any questions with your health care provider.  Keep all follow-up visits as told by your health care provider. This is important. Contact a health care provider if:  You feel nauseous.  You vomit. Get help right away if:  You are taking oral or injectable medicines and you have an erection that lasts longer than 4 hours. If your health care provider is unavailable, go to the nearest emergency room for evaluation. An erection that lasts much longer than 4 hours can result in permanent damage to your penis.  You have severe pain in your groin or abdomen.  You develop redness or severe swelling of your penis.  You have redness spreading up into your groin or lower abdomen.  You are  unable to urinate.  You experience chest pain or a rapid heart beat (palpitations) after taking oral medicines. Summary  Erectile dysfunction (ED) is the inability to get or keep an erection during sexual intercourse. This problem can usually be treated successfully.  This condition is diagnosed based on a physical exam, your symptoms, and tests to determine the cause. Treatment varies depending on the cause, and may include medicines, hormone therapy, surgery, or vacuum pump.  You may need follow-up visits to make sure that you are using your medicines or devices correctly.  Get help right away if you are taking or injecting medicines and you have an erection that lasts longer than 4 hours. This information is not intended to replace advice given to you by your health care provider. Make sure you discuss any questions you have with your health care provider. Document Released: 04/06/2000 Document Revised: 04/25/2016 Document Reviewed: 04/25/2016 Elsevier Interactive Patient Education  2017 [employer].

## 2017-05-23 NOTE — Progress Notes (Signed)
Chief Complaint  Patient presents with  . Medication Refill    glipizide, metoprolol, lisinopril-hctz and check up    HPI   Diabetes Mellitus: Patient presents for follow up of diabetes. Symptoms: hyperglycemia. Symptoms have gradually improved. Patient denies foot ulcerations, nausea, polydipsia and polyuria.  Evaluation to date has been included: hemoglobin A1C.  Home sugars: BGs range between 140 and 160.   Lab Results  Component Value Date   HGBA1C 13.1 05/01/2017   Patient reports that she is seeing 140-160s fasting  He is taking Farxiga, metformin and glipizide He denies hypoglycemia He is not exercising  Depression screen [facility] 2/9 05/23/2017 09/07/2016 09/05/2016 05/30/2016 01/28/2015  Decreased Interest 0 1 0 0 0  Down, Depressed, Hopeless 0 1 0 0 0  PHQ - 2 Score 0 2 0 0 0    Hypertension: Patient here for follow-up of elevated blood pressure. He is not exercising and is adherent to low salt diet.  Blood pressure is well controlled at home. Cardiac symptoms none. Patient denies chest pain, chest pressure/discomfort, claudication, exertional chest pressure/discomfort, fatigue, irregular heart beat, lower extremity edema and near-syncope.  Cardiovascular risk factors: diabetes mellitus and hypertension. Use of agents associated with hypertension: none. History of target organ damage: none. BP Readings from Last 3 Encounters:  05/23/17 120/70  05/01/17 133/82  03/23/17 137/85    Erectile dysfunction Pt reports that he does not have the usual sex drive He reports that he has a morning erection but it is a partial erection He states that he gets an erection sometimes but typically after penetration he loses the erection He does not always maintain his erection until ejaculation He is taking an antidepressant for his mood which is helping him.      Past Medical History:  Diagnosis Date  . Diabetes mellitus   . Hypertension   . Seasonal allergies     Current Outpatient  Medications  Medication Sig Dispense Refill  . aspirin 81 MG chewable tablet Chew 81 mg by mouth daily.    Marland Kitchen. FARXIGA 10 MG TABS tablet TAKE 1 TABLET BY MOUTH EVERY DAY 90 tablet 1  . glipiZIDE (GLUCOTROL XL) 2.5 MG 24 hr tablet TAKE 1 TABLET BY MOUTH DAILY WITH BREAKFAST**APPOINTMENT NEEDED FOR FURTHER REFILLS** 90 tablet 0  . lisinopril-hydrochlorothiazide (PRINZIDE,ZESTORETIC) 20-12.5 MG tablet TAKE ONE DAILY FOR BLOOD PRESSURE 30 tablet 6  . metFORMIN (GLUCOPHAGE) 1000 MG tablet Take 1 tablet (1,000 mg total) by mouth 2 (two) times daily with a meal. 180 tablet 1  . metoprolol succinate (TOPROL-XL) 50 MG 24 hr tablet Take 1 tablet (50 mg total) by mouth daily. Take with or immediately following a meal. 30 tablet 6  . Continuous Blood Gluc Receiver (FREESTYLE LIBRE 14 DAY READER) DEVI 1 Device by Does not apply route 3 (three) times daily. E11.65 Use for continuous blood glucose monitoring 1 Device 0  . Continuous Blood Gluc Sensor (FREESTYLE LIBRE 14 DAY SENSOR) MISC 1 Device by Does not apply route 3 (three) times daily. E11.65 2 each 6  . glipiZIDE (GLUCOTROL XL) 2.5 MG 24 hr tablet TAKE 1 TABLET BY MOUTH DAILY WITH BREAKFAST**NEEDED FOR FURTHER REFILLS** 90 tablet 1  . nitroGLYCERIN (NITROSTAT) 0.4 MG SL tablet Place 1 tablet (0.4 mg total) under the tongue every 5 (five) minutes as needed for chest pain. (Patient not taking: Reported on 05/23/2017) 10 tablet 0   No current facility-administered medications for this visit.     Allergies:  Allergies  Allergen Reactions  .  Benadryl [Diphenhydramine Hcl] Anxiety    Past Surgical History:  Procedure Laterality Date  . HERNIA REPAIR    . right leg fracture      Social History   Socioeconomic History  . Marital status: Married    Spouse name: None  . Number of children: None  . Years of education: None  . Highest education level: None  Social Needs  . Financial resource strain: None  . Food insecurity - worry: None  . Food  insecurity - inability: None  . Transportation needs - medical: None  . Transportation needs - non-medical: None  Occupational History  . None  Tobacco Use  . Smoking status: Never Smoker  . Smokeless tobacco: Never Used  Substance and Sexual Activity  . Alcohol use: No  . Drug use: No  . Sexual activity: None  Other Topics Concern  . None  Social History Narrative  . None    Family History  Problem Relation Age of Onset  . Heart disease Mother   . Heart failure Mother   . Heart disease Father   . Diabetes Father   . Heart failure Father      ROS Review of Systems See HPI Constitution: No fevers or chills No malaise No diaphoresis Skin: No rash or itching Eyes: no blurry vision, no double vision GU: no dysuria or hematuria Neuro: no dizziness or headaches all others reviewed and negative   Objective: Vitals:   05/23/17 1346  BP: 120/70  Pulse: 88  Resp: 16  Temp: 97.9 F (36.6 C)  TempSrc: Oral  SpO2: 96%  Weight: 221 lb 3.2 oz (100.3 kg)  Height: 5\' 10"  (1.778 m)    Physical Exam  Constitutional: He is oriented to person, place, and time. He appears well-developed and well-nourished.  HENT:  Head: Normocephalic and atraumatic.  Eyes: Conjunctivae and EOM are normal.  Cardiovascular: Normal rate, regular rhythm and normal heart sounds.  No murmur heard. Pulmonary/Chest: Effort normal and breath sounds normal. No stridor. No respiratory distress. He has no wheezes.  Neurological: He is alert and oriented to person, place, and time.    Assessment and Plan Rorey was seen today for medication refill.  Diagnoses and all orders for this visit:  Essential hypertension- bp meds refilled today Pt to return for labs -     metoprolol succinate (TOPROL-XL) 50 MG 24 hr tablet; Take 1 tablet (50 mg total) by mouth daily. Take with or immediately following a meal. -     lisinopril-hydrochlorothiazide (PRINZIDE,ZESTORETIC) 20-12.5 MG tablet; TAKE ONE DAILY FOR  BLOOD PRESSURE -     Lipid panel; Future -     Comprehensive metabolic panel; Future  Need for Tdap vaccination -     Tdap vaccine greater than or equal to 7yo IM  Uncontrolled type 2 diabetes mellitus with hyperglycemia (HCC)-  Pt sees Endocrinology Discussed blood glucose monitoring Will check labs Discussed his compliance -     Lipid panel; Future -     Comprehensive metabolic panel; Future -     TSH; Future  Erectile dysfunction due to arterial insufficiency- pt interested in meds Discussed Cialis Discussed checking Testosterone -     TestT+TestF+SHBG; Future -     TSH; Future  Dysthymic -     TSH; Future  Screening for prostate cancer- AA male will screen with PSA -     PSA; Future  Other orders -     Continuous Blood Gluc Receiver (FREESTYLE LIBRE 14 DAY READER) DEVI;  1 Device by Does not apply route 3 (three) times daily. E11.65 Use for continuous blood glucose monitoring -     Continuous Blood Gluc Sensor (FREESTYLE LIBRE 14 DAY SENSOR) MISC; 1 Device by Does not apply route 3 (three) times daily. E11.65     Khyleigh Furney A Donel Osowski

## 2017-05-25 ENCOUNTER — Ambulatory Visit (INDEPENDENT_AMBULATORY_CARE_PROVIDER_SITE_OTHER): Payer: 59 | Admitting: Family Medicine

## 2017-05-25 DIAGNOSIS — N5201 Erectile dysfunction due to arterial insufficiency: Secondary | ICD-10-CM

## 2017-05-25 DIAGNOSIS — F341 Dysthymic disorder: Secondary | ICD-10-CM

## 2017-05-25 DIAGNOSIS — I10 Essential (primary) hypertension: Secondary | ICD-10-CM

## 2017-05-25 DIAGNOSIS — E1165 Type 2 diabetes mellitus with hyperglycemia: Secondary | ICD-10-CM

## 2017-05-25 DIAGNOSIS — Z125 Encounter for screening for malignant neoplasm of prostate: Secondary | ICD-10-CM

## 2017-05-30 LAB — PSA: Prostate Specific Ag, Serum: 1.6 ng/mL (ref 0.0–4.0)

## 2017-05-30 LAB — LIPID PANEL
Chol/HDL Ratio: 5.2 ratio — ABNORMAL HIGH (ref 0.0–5.0)
Cholesterol, Total: 268 mg/dL — ABNORMAL HIGH (ref 100–199)
HDL: 52 mg/dL (ref >39)
LDL Calculated: 180 mg/dL — ABNORMAL HIGH (ref 0–99)
Triglycerides: 178 mg/dL — ABNORMAL HIGH (ref 0–149)
VLDL CHOLESTEROL CAL: 36 mg/dL (ref 5–40)

## 2017-05-30 LAB — COMPREHENSIVE METABOLIC PANEL
A/G RATIO: 1.5 (ref 1.2–2.2)
ALBUMIN: 4.4 g/dL (ref 3.5–5.5)
ALT: 10 IU/L (ref 0–44)
AST: 10 IU/L (ref 0–40)
Alkaline Phosphatase: 35 IU/L — ABNORMAL LOW (ref 39–117)
BILIRUBIN TOTAL: 0.4 mg/dL (ref 0.0–1.2)
BUN / CREAT RATIO: 15 (ref 9–20)
BUN: 15 mg/dL (ref 6–24)
CHLORIDE: 97 mmol/L (ref 96–106)
CO2: 21 mmol/L (ref 20–29)
Calcium: 9.9 mg/dL (ref 8.7–10.2)
Creatinine, Ser: 1.03 mg/dL (ref 0.76–1.27)
GFR calc non Af Amer: 81 mL/min/{1.73_m2} (ref >59)
GFR, EST AFRICAN AMERICAN: 93 mL/min/{1.73_m2} (ref >59)
GLOBULIN, TOTAL: 2.9 g/dL (ref 1.5–4.5)
Glucose: 254 mg/dL — ABNORMAL HIGH (ref 65–99)
POTASSIUM: 4.4 mmol/L (ref 3.5–5.2)
SODIUM: 137 mmol/L (ref 134–144)
TOTAL PROTEIN: 7.3 g/dL (ref 6.0–8.5)

## 2017-05-30 LAB — TSH: TSH: 2.2 u[IU]/mL (ref 0.450–4.500)

## 2017-05-30 LAB — TESTT+TESTF+SHBG
Sex Hormone Binding: 27.6 nmol/L (ref 19.3–76.4)
TESTOSTERONE, TOTAL: 378.7 ng/dL (ref 264.0–916.0)
Testosterone, Free: 8.9 pg/mL (ref 7.2–24.0)

## 2017-06-11 ENCOUNTER — Encounter: Payer: Self-pay | Admitting: Family Medicine

## 2017-06-20 ENCOUNTER — Ambulatory Visit (INDEPENDENT_AMBULATORY_CARE_PROVIDER_SITE_OTHER): Payer: 59 | Admitting: Family Medicine

## 2017-06-20 ENCOUNTER — Encounter: Payer: Self-pay | Admitting: Family Medicine

## 2017-06-20 ENCOUNTER — Other Ambulatory Visit: Payer: Self-pay

## 2017-06-20 VITALS — BP 123/78 | HR 98 | Temp 98.5°F | Resp 17 | Ht 70.0 in | Wt 216.8 lb

## 2017-06-20 DIAGNOSIS — E119 Type 2 diabetes mellitus without complications: Secondary | ICD-10-CM

## 2017-06-20 DIAGNOSIS — N5201 Erectile dysfunction due to arterial insufficiency: Secondary | ICD-10-CM

## 2017-06-20 DIAGNOSIS — Z6831 Body mass index (BMI) 31.0-31.9, adult: Secondary | ICD-10-CM | POA: Diagnosis not present

## 2017-06-20 DIAGNOSIS — I10 Essential (primary) hypertension: Secondary | ICD-10-CM | POA: Diagnosis not present

## 2017-06-20 DIAGNOSIS — E6609 Other obesity due to excess calories: Secondary | ICD-10-CM | POA: Diagnosis not present

## 2017-06-20 DIAGNOSIS — E1165 Type 2 diabetes mellitus with hyperglycemia: Secondary | ICD-10-CM | POA: Diagnosis not present

## 2017-06-20 DIAGNOSIS — Z1211 Encounter for screening for malignant neoplasm of colon: Secondary | ICD-10-CM

## 2017-06-20 DIAGNOSIS — Z23 Encounter for immunization: Secondary | ICD-10-CM | POA: Diagnosis not present

## 2017-06-20 LAB — POCT GLYCOSYLATED HEMOGLOBIN (HGB A1C): Hemoglobin A1C: 13.3

## 2017-06-20 MED ORDER — METFORMIN HCL 1000 MG PO TABS: 1000.0000 mg | ORAL_TABLET | Freq: Two times a day (BID) | ORAL | 1 refills | Status: DC

## 2017-06-20 MED ORDER — ATORVASTATIN CALCIUM 20 MG PO TABS: 20.0000 mg | ORAL_TABLET | Freq: Every day | ORAL | 3 refills | Status: DC

## 2017-06-20 MED ORDER — GLIPIZIDE ER 5 MG PO TB24: 5.0000 mg | ORAL_TABLET | Freq: Every day | ORAL | 0 refills | Status: DC

## 2017-06-20 MED ORDER — TADALAFIL 5 MG PO TABS: 5.0000 mg | ORAL_TABLET | Freq: Every day | ORAL | 0 refills | Status: DC | PRN

## 2017-06-20 NOTE — Patient Instructions (Addendum)
Drug-Drug: nitroGLYCERIN and tadalafil  The risk of hypotension induced by Nitrates may be increased by taking Cialis if you took a  Nitrate.  Do not take Cialis if you take nitrates.   For Diabetes Take Glipizide XL 2.5mg  two tablets until you finish your current meds then pick up your new prescription of Glipizide XL 5.0mg  and take that once a day  Continue Farxiga 10mg  once a day   Continue metformin 1000mg  twice a day    IF you received an x-ray today, you will receive [facility] from [facility]. Please contact [facility] at [phone] with questions or concerns regarding your invoice.   IF you received labwork today, you will receive an invoice from [facility]. Please contact [facility] at [phone] with questions or concerns regarding your invoice.   Our billing staff will not be able to assist you with questions regarding bills from these companies.  You will be contacted with the lab results as soon as they are available. The fastest way to get your results is to activate your My Chart account. Instructions are located on the last page of this paperwork. If you have not heard from us regarding the results in 2 weeks, please contact this office.    Low Glycemic Foods (20-49)  Breakfast Cereals: All-Bran                All-Bran Fruit ' n Oats Fiber One               Oatmeal (not instant)  Oat bran Fruits and fruit juices: (Limit to 1-2 servings per day) Apples               Apricots (fresh & dried)  Blackberries            Blueberries Cherries                  Cranberries             Peaches                  Pears                       Plums                       Prunes Grapefruit                Raspberries            Strawberries           Tangerine Apple juice             Grapefruit juice Tomato juice  Beans and legumes (fresh-cooked): Black-eyed peas     Butter beans Chick peas              Lentils     Green beans           Lima beans                Kidney beans          Navy beans  Non-starchy vegetables: Asparagus, bok choy, broccoli, cabbage, cauliflower, celery, cucumber, greens, lettuce, mushrooms, peppers, tomatoes, okra, onions, snow peas, spinach, summer squash  Grains: Barley                                Bulgur Rye  Wild rice  Nuts and oils : Almonds         Peanuts     Sunflower seeds  Hazelnuts      Pecans          Walnuts Oils that are liquid at room temperature  Dairy, fish, and meat: Milk, skim                         Lowfat cheese Yogurt, lowfat, fruit sugar sweetened Lean red meat                      Fish  Skinless chicken & [place]    Shellfish Moderate Glycemic Foods (50-69)  Breakfast Cereals: Bran Buds                             Bran Chex Just Right                            Mini-Wheats Special K         Swiss muesli  Fruits: Banana (under-ripe)             Dates Figs                                      Grapes Kiwi                                      Mango Oranges                               Raisins  Fruit Juices: Cranberry juice                    Orange juice  Beans and legumes: [place]-type baked beans Canned pinto, kidney, or navy beans Green peas  Vegetables: Beets                         Raw Carrots  Sweet potato              Yam Corn on the cob  Breads: Pita (pocket) bread          Oat bran bread Pumpernickel bread           Rye bread Wheat bread, high fiber       Grains: Cornmeal                           Rice, brown   Rice, white                         Couscous Pasta: Macaroni                           Pizza  cheese Raviolimeat filled           Spaghetti, white        Nuts: Cashews                           Macadamia  Snacks: Chocolate                    Ice cream,lowfat  Muffin                               Popcorn High Glycemic Foods (70-100)   Breakfast Cereals: Cheerios                 Corn Chex Corn Flakes             Cream of Wheat Grape Nuts              Grape Nut Flakes Life                 Nutri-Grain       Puffed Rice               Puffed Wheat Rice Chex                 Rice Krispies Shredded Wheat             Team Total Fruits: Pineapple                 Watermelon Banana (over-ripe)  Beverages: Sodas, sweet tea, pineapple juice  Vegetables: Potato, baked, boiled, fried, mashed [place] fries Canned or frozen corn Cooked carrots Parsnips Winter squash  Breads: Most breads (white and whole grain) Bagels                     Bread sticks Bread stuffing          Kaiser roll Dinner rolls  Grains: Rice, instant          Tapioca, with milk  Candy and most cookies Snacks: Donuts                      Corn chips        Jelly beans                 Pretzels Pastries                             Restaurant and ethnic foods Most [place] food (sugar in stir fry or wok sauces) Teriyaki-style meats and vegetables

## 2017-06-20 NOTE — Progress Notes (Signed)
Chief Complaint  Patient presents with  . Diabetes    1 month f/u    HPI   Diabetes Mellitus: Patient presents for follow up of diabetes. Symptoms: none. Symptoms have been basically asymptomatic. Patient denies hypoglycemia , increase appetite, nausea, paresthesia of the feet, polydipsia, polyuria and visual disturbances.  Evaluation to date has been included: hemoglobin A1C.  Home sugars: patient does not check sugars.  Lab Results  Component Value Date   HGBA1C 13.3 06/20/2017    The 10-year ASCVD risk score Denman George(Goff DC Montez HagemanJr., et al., 2013) is: 20.7%   Values used to calculate the score:     Age: 57 years     Sex: Male     Is Non-Hispanic African American: Yes     Diabetic: Yes     Tobacco smoker: No     Systolic Blood Pressure: 123 mmHg     Is BP treated: Yes     HDL Cholesterol: 52 mg/dL     Total Cholesterol: 268 mg/dL   Hypertension: Patient here for follow-up of elevated blood pressure. He is exercising and is adherent to low salt diet.  Blood pressure is well controlled at home. Cardiac symptoms none. Patient denies chest pain, chest pressure/discomfort, claudication, dyspnea, exertional chest pressure/discomfort, fatigue, irregular heart beat, lower extremity edema and near-syncope.  Cardiovascular risk factors: advanced age (older than 4455 for men, 465 for women), diabetes mellitus, dyslipidemia, hypertension, male gender and obesity (BMI >= 30 kg/m2). Use of agents associated with hypertension: none. History of target organ damage: none. BP Readings from Last 3 Encounters:  06/20/17 123/78  05/23/17 120/70  05/01/17 133/82    Dyslipidemia: Patient presents for evaluation of lipids.  Compliance with treatment thus far has been good.  A repeat fasting lipid profile was done.  The patient does not use medications that may worsen dyslipidemias (corticosteroids, progestins, anabolic steroids, diuretics, beta-blockers, amiodarone, cyclosporine, olanzapine). The patient exercises  three times a week.  The patient is not known to have coexisting coronary artery disease.  Lab Results  Component Value Date   CHOL 268 (H) 05/25/2017   CHOL 237 (H) 11/15/2013   CHOL 189 01/11/2009   Lab Results  Component Value Date   HDL 52 05/25/2017   HDL 65 11/15/2013   HDL 71 01/11/2009   Lab Results  Component Value Date   LDLCALC 180 (H) 05/25/2017   LDLCALC 148 (H) 11/15/2013   LDLCALC 106 (H) 01/11/2009   Lab Results  Component Value Date   TRIG 178 (H) 05/25/2017   TRIG 119 11/15/2013   TRIG 62 01/11/2009   Lab Results  Component Value Date   CHOLHDL 5.2 (H) 05/25/2017   CHOLHDL 3.6 11/15/2013   CHOLHDL 2.7 Ratio 01/11/2009   No results found for: LDLDIRECT    Obesity: Patient complains of obesity. Patient cites health as reasons for wanting to lose weight. Wt Readings from Last 3 Encounters:  06/20/17 216 lb 12.8 oz (98.3 kg)  05/23/17 221 lb 3.2 oz (100.3 kg)  05/01/17 212 lb (96.2 kg)   Current Exercise Habits walking  Current Eating Habits Number of regular meals per day: 3 Number of snacking episodes per day: 4 Who shops for food? patient Who prepares food? patient Who eats with patient? patient, friend and wife Binge behavior?: yes - while watching tv Purge behavior? no Anorexic behavior? no Eating precipitated by stress? no Guilt feelings associated with eating? no  Other Potential Contributing Factors Use of alcohol: average 0 drinks/week Use of  medications that may cause weight gain none History of past abuse? none Comorbidities: diabetes mellitus, dyslipidemias and hypertension     Past Medical History:  Diagnosis Date  . Diabetes mellitus   . Hypertension   . Seasonal allergies     Current Outpatient Medications  Medication Sig Dispense Refill  . FARXIGA 10 MG TABS tablet TAKE 1 TABLET BY MOUTH EVERY DAY 90 tablet 1  . glipiZIDE (GLUCOTROL XL) 5 MG 24 hr tablet Take 1 tablet (5 mg total) by mouth daily with breakfast.  90 tablet 0  . lisinopril-hydrochlorothiazide (PRINZIDE,ZESTORETIC) 20-12.5 MG tablet TAKE ONE DAILY FOR BLOOD PRESSURE 30 tablet 6  . metFORMIN (GLUCOPHAGE) 1000 MG tablet Take 1 tablet (1,000 mg total) by mouth 2 (two) times daily with a meal. 180 tablet 1  . aspirin 81 MG chewable tablet Chew 81 mg by mouth daily.    Marland Kitchen atorvastatin (LIPITOR) 20 MG tablet Take 1 tablet (20 mg total) by mouth daily. 90 tablet 3  . Continuous Blood Gluc Receiver (FREESTYLE LIBRE 14 DAY READER) DEVI 1 Device by Does not apply route 3 (three) times daily. E11.65 Use for continuous blood glucose monitoring 1 Device 0  . Continuous Blood Gluc Sensor (FREESTYLE LIBRE 14 DAY SENSOR) MISC 1 Device by Does not apply route 3 (three) times daily. E11.65 2 each 6  . nitroGLYCERIN (NITROSTAT) 0.4 MG SL tablet Place 1 tablet (0.4 mg total) under the tongue every 5 (five) minutes as needed for chest pain. (Patient not taking: Reported on 05/23/2017) 10 tablet 0  . tadalafil (CIALIS) 5 MG tablet Take 1 tablet (5 mg total) by mouth daily as needed for erectile dysfunction. 10 tablet 0   No current facility-administered medications for this visit.     Allergies:  Allergies  Allergen Reactions  . Benadryl [Diphenhydramine Hcl] Anxiety    Past Surgical History:  Procedure Laterality Date  . HERNIA REPAIR    . right leg fracture      Social History   Socioeconomic History  . Marital status: Married    Spouse name: None  . Number of children: None  . Years of education: None  . Highest education level: None  Social Needs  . Financial resource strain: None  . Food insecurity - worry: None  . Food insecurity - inability: None  . Transportation needs - medical: None  . Transportation needs - non-medical: None  Occupational History  . None  Tobacco Use  . Smoking status: Never Smoker  . Smokeless tobacco: Never Used  Substance and Sexual Activity  . Alcohol use: No  . Drug use: No  . Sexual activity: None    Other Topics Concern  . None  Social History Narrative  . None    Family History  Problem Relation Age of Onset  . Heart disease Mother   . Heart failure Mother   . Heart disease Father   . Diabetes Father   . Heart failure Father      Review of Systems  Constitutional: Negative for chills and fever.  Respiratory: Negative for cough, shortness of breath and wheezing.   Cardiovascular: Negative for chest pain, palpitations and leg swelling.  Gastrointestinal: Negative for abdominal pain, diarrhea, nausea and vomiting.  Skin: Negative for itching and rash.  Neurological: Negative for dizziness, tingling, tremors and headaches.  Psychiatric/Behavioral: Negative for depression. The patient is not nervous/anxious.      Objective: Vitals:   06/20/17 1336  BP: 123/78  Pulse: 98  Resp: 17  Temp: 98.5 F (36.9 C)  TempSrc: Oral  SpO2: 96%  Weight: 216 lb 12.8 oz (98.3 kg)  Height: 5\' 10"  (1.778 m)  Body mass index is 31.11 kg/m.   Physical Exam  Constitutional: He is oriented to person, place, and time. He appears well-developed and well-nourished.  HENT:  Head: Normocephalic and atraumatic.  Eyes: Conjunctivae and EOM are normal.  Neck: Normal range of motion. No thyromegaly present.  Cardiovascular: Normal rate, regular rhythm and normal heart sounds.  No murmur heard. Pulmonary/Chest: Effort normal and breath sounds normal. No stridor. No respiratory distress.  Neurological: He is alert and oriented to person, place, and time.  Skin: Skin is warm. Capillary refill takes less than 2 seconds.  Psychiatric: He has a normal mood and affect. His behavior is normal. Judgment and thought content normal.    Assessment and Plan Vernell was seen today for diabetes.  Diagnoses and all orders for this visit:  Uncontrolled type 2 diabetes mellitus with hyperglycemia (HCC) -     POCT glycosylated hemoglobin (Hb A1C)  Need for prophylactic vaccination against Streptococcus  pneumoniae (pneumococcus) -     Cancel: Pneumococcal conjugate vaccine 13-valent IM -     Pneumococcal polysaccharide vaccine 23-valent greater than or equal to 2yo subcutaneous/IM  Colon cancer screening -     Ambulatory referral to Gastroenterology  Type 2 diabetes mellitus without complication, without long-term current use of insulin (HCC)- discussed that his meds will need to be increased until he achieve goal of a1c <7 If not improved on next check will have to do insulin  Discussed risk factors for heart attack and stroke Reviewed glycemic index food Pt plans to do some juicing with protein powder as a goal -     metFORMIN (GLUCOPHAGE) 1000 MG tablet; Take 1 tablet (1,000 mg total) by mouth 2 (two) times daily with a meal. -     glipiZIDE (GLUCOTROL XL) 5 MG 24 hr tablet; Take 1 tablet (5 mg total) by mouth daily with breakfast.  Essential hypertension- bp pressure is at goal on current bp meds Pt appropriately on ace inhibitor  Class 1 obesity due to excess calories with serious comorbidity and body mass index (BMI) of 31.0 to 31.9 in adult- discussed dietary modification   Erectile Dysfunction -  Will do trial of Cialis -     tadalafil (CIALIS) 5 MG tablet; Take 1 tablet (5 mg total) by mouth daily as needed for erectile dysfunction.  Dyslipidemia- discussed lipids Will treat with lipitor Advised to take aspirin daily -     atorvastatin (LIPITOR) 20 MG tablet; Take 1 tablet (20 mg total) by mouth daily.  A total of 50 minutes were spent face-to-face with the patient during this encounter and over half of that time was spent on counseling and coordination of care.    Corby Villasenor A Chantella Creech

## 2017-07-16 ENCOUNTER — Other Ambulatory Visit: Payer: Self-pay | Admitting: Physician Assistant

## 2017-07-16 ENCOUNTER — Telehealth: Payer: Self-pay | Admitting: Family Medicine

## 2017-07-16 ENCOUNTER — Other Ambulatory Visit: Payer: Self-pay | Admitting: Family Medicine

## 2017-07-16 DIAGNOSIS — F341 Dysthymic disorder: Secondary | ICD-10-CM

## 2017-07-19 ENCOUNTER — Other Ambulatory Visit: Payer: Self-pay | Admitting: Family Medicine

## 2017-07-19 DIAGNOSIS — F341 Dysthymic disorder: Secondary | ICD-10-CM

## 2017-07-19 NOTE — Telephone Encounter (Signed)
Called pt to advise him of needing an appt with Deliah BostonMichael Clark - made an appt for Monday 4/1

## 2017-07-22 ENCOUNTER — Encounter: Payer: Self-pay | Admitting: Physician Assistant

## 2017-07-22 ENCOUNTER — Ambulatory Visit (INDEPENDENT_AMBULATORY_CARE_PROVIDER_SITE_OTHER): Payer: 59 | Admitting: Physician Assistant

## 2017-07-22 VITALS — BP 117/76 | HR 80 | Temp 98.1°F | Resp 16 | Ht 70.0 in | Wt 218.8 lb

## 2017-07-22 DIAGNOSIS — F341 Dysthymic disorder: Secondary | ICD-10-CM | POA: Diagnosis not present

## 2017-07-22 MED ORDER — SERTRALINE HCL 50 MG PO TABS
25.0000 mg | ORAL_TABLET | Freq: Every day | ORAL | 3 refills | Status: DC
Start: 2017-07-22 — End: 2017-09-23

## 2017-07-22 NOTE — Patient Instructions (Signed)
     IF you received an x-ray today, you will receive an invoice from [facility]. Please contact [facility] at [phone] with questions or concerns regarding your invoice.   IF you received labwork today, you will receive an invoice from [facility]. Please contact [facility] at [phone] with questions or concerns regarding your invoice.   Our billing staff will not be able to assist you with questions regarding bills from these companies.  You will be contacted with the lab results as soon as they are available. The fastest way to get your results is to activate your My Chart account. Instructions are located on the last page of this paperwork. If you have not heard from us regarding the results in 2 weeks, please contact this office.     

## 2017-07-22 NOTE — Progress Notes (Signed)
    07/22/2017 3:11 PM   DOB: 03-08-1961 / MRN: [id number]  SUBJECTIVE:  Zachary HumphreysJoel Walsh is a 57 y.o. male presenting for refills of low dose sertraline.  He takes this for a history of anxiety and depression.  Tells me that the sertraline has largely helped with depressed mood and mood swings, particularly in the morning when he most often feels "low."  He denies SI/HI.  Depression screen PHQ 2/9 07/22/2017  Decreased Interest 0  Down, Depressed, Hopeless 0  PHQ - 2 Score 0  Altered sleeping -  Tired, decreased energy -  Change in appetite -  Feeling bad or failure about yourself  -  Trouble concentrating -  Moving slowly or fidgety/restless -  Suicidal thoughts -  PHQ-9 Score -  Difficult doing work/chores -     He is allergic to benadryl [diphenhydramine hcl].   He  has a past medical history of Diabetes mellitus, Hypertension, and Seasonal allergies.    He  reports that he has never smoked. He has never used smokeless tobacco. He reports that he does not drink alcohol or use drugs. He  has no sexual activity history on file. The patient  has a past surgical history that includes right leg fracture and Hernia repair.  His family history includes Diabetes in his father; Heart disease in his father and mother; Heart failure in his father and mother.  Review of Systems  Constitutional: Negative for chills, diaphoresis and fever.  Eyes: Negative.   Respiratory: Negative for cough, hemoptysis, sputum production, shortness of breath and wheezing.   Cardiovascular: Negative for chest pain, orthopnea and leg swelling.  Gastrointestinal: Negative for abdominal pain, blood in stool, constipation, diarrhea, heartburn, melena, nausea and vomiting.  Genitourinary: Negative for flank pain.  Skin: Negative for rash.  Neurological: Negative for dizziness, sensory change, speech change, focal weakness and headaches.    The problem list and medications were reviewed and updated by myself where  necessary and exist elsewhere in the encounter.   OBJECTIVE:  BP 117/76   Pulse 80   Temp 98.1 F (36.7 C)   Resp 16   Ht 5\' 10"  (1.778 m)   Wt 218 lb 12.8 oz (99.2 kg)   SpO2 97%   BMI 31.39 kg/m   Physical Exam  Constitutional: He appears well-developed. He is active and cooperative.  Non-toxic appearance.  Cardiovascular: Normal rate.  Pulmonary/Chest: Effort normal. No tachypnea.  Neurological: He is alert.  Skin: Skin is warm and dry. He is not diaphoretic. No pallor.  Psychiatric: He has a normal mood and affect. His behavior is normal. Judgment and thought content normal.  Vitals reviewed.   No results found for this or any previous visit (from the past 72 hour(s)).  No results found.  ASSESSMENT AND PLAN:  Zachary DowseJoel was seen today for medication refill.  Diagnoses and all orders for this visit:  Dysthymia:  -     sertraline (ZOLOFT) 50 MG tablet; Take 0.5 tablets (25 mg total) by mouth daily.    The patient is advised to call or return to clinic if he does not see an improvement in symptoms, or to seek the care of the closest emergency department if he worsens with the above plan.   Zachary BostonMichael Dayona Walsh, MHS, PA-C [facility] 07/22/2017 3:11 PM

## 2017-07-26 NOTE — Progress Notes (Signed)
Nurse visit only.  Did not see provider at this visit.  

## 2017-08-21 ENCOUNTER — Encounter: Payer: Self-pay | Admitting: Family Medicine

## 2017-08-21 ENCOUNTER — Ambulatory Visit (INDEPENDENT_AMBULATORY_CARE_PROVIDER_SITE_OTHER): Payer: 59 | Admitting: Family Medicine

## 2017-08-21 ENCOUNTER — Other Ambulatory Visit: Payer: Self-pay

## 2017-08-21 VITALS — BP 138/86 | HR 84 | Temp 98.1°F | Resp 17 | Ht 70.0 in | Wt 217.4 lb

## 2017-08-21 DIAGNOSIS — I10 Essential (primary) hypertension: Secondary | ICD-10-CM

## 2017-08-21 DIAGNOSIS — E6609 Other obesity due to excess calories: Secondary | ICD-10-CM | POA: Diagnosis not present

## 2017-08-21 DIAGNOSIS — E118 Type 2 diabetes mellitus with unspecified complications: Secondary | ICD-10-CM | POA: Diagnosis not present

## 2017-08-21 DIAGNOSIS — Z6831 Body mass index (BMI) 31.0-31.9, adult: Secondary | ICD-10-CM

## 2017-08-21 LAB — POCT GLYCOSYLATED HEMOGLOBIN (HGB A1C): Hemoglobin A1C: 12.9

## 2017-08-21 MED ORDER — GLUCAGON (RDNA) 1 MG IJ KIT: 1.0000 mg | PACK | Freq: Once | INTRAMUSCULAR | 12 refills | Status: AC | PRN

## 2017-08-21 MED ORDER — DULAGLUTIDE 1.5 MG/0.5ML ~~LOC~~ SOAJ: 1.5000 mg | SUBCUTANEOUS | 0 refills | Status: DC

## 2017-08-21 NOTE — Patient Instructions (Addendum)
For COLONOSCOPY Call [facility] Address: [address], [place], [place] [address]  Phone: [phone]  STOP the GLIPIZIDE Check your fasting glucose every day  Your goal is a glucose between 100 - 120      IF you received an x-ray today, you will receive an invoice from [facility]. Please contact [facility] at [phone] with questions or concerns regarding your invoice.   IF you received labwork today, you will receive an invoice from [facility]. Please contact [facility] at [phone] with questions or concerns regarding your invoice.   Our billing staff will not be able to assist you with questions regarding bills from these companies.  You will be contacted with the lab results as soon as they are available. The fastest way to get your results is to activate your My Chart account. Instructions are located on the last page of this paperwork. If you have not heard from [place] regarding the results in 2 weeks, please contact this office.     Hypoglycemia Hypoglycemia occurs when the level of sugar (glucose) in the blood is too low. Glucose is a type of sugar that provides the body's main source of energy. Certain hormones (insulin and glucagon) control the level of glucose in the blood. Insulin lowers blood glucose, and glucagon increases blood glucose. Hypoglycemia can result from having too much insulin in the bloodstream, or from not eating enough food that contains glucose. Hypoglycemia can happen in people who do or do not have diabetes. It can develop quickly, and it can be a medical emergency. What are the causes? Hypoglycemia occurs most often in people who have diabetes. If you have diabetes, hypoglycemia may be caused by:  Diabetes medicine.  Not eating enough, or not eating often enough.  Increased physical activity.  Drinking alcohol, especially when you have not eaten recently.  If you do not have diabetes, hypoglycemia may be caused  by:  A tumor in the pancreas. The pancreas is the organ that makes insulin.  Not eating enough, or not eating for long periods at a time (fasting).  Severe infection or illness that affects the liver, heart, or kidneys.  Certain medicines.  You may also have reactive hypoglycemia. This condition causes hypoglycemia within 4 hours of eating a meal. This may occur after having stomach surgery. Sometimes, the cause of reactive hypoglycemia is not known. What increases the risk? Hypoglycemia is more likely to develop in:  People who have diabetes and take medicines to lower blood glucose.  People who abuse alcohol.  People who have a severe illness.  What are the signs or symptoms? Hypoglycemia may not cause any symptoms. If you have symptoms, they may include:  Hunger.  Anxiety.  Sweating and feeling clammy.  Confusion.  Dizziness or feeling light-headed.  Sleepiness.  Nausea.  Increased heart rate.  Headache.  Blurry vision.  Seizure.  Nightmares.  Tingling or numbness around the mouth, lips, or tongue.  A change in speech.  Decreased ability to concentrate.  A change in coordination.  Restless sleep.  Tremors or shakes.  Fainting.  Irritability.  How is this diagnosed? Hypoglycemia is diagnosed with a blood test to measure your blood glucose level. This blood test is done while you are having symptoms. Your health care provider may also do a physical exam and review your medical history. If you do not have diabetes, other tests may be done to find the cause of your hypoglycemia. How is this treated? This condition can often be  treated by immediately eating or drinking something that contains glucose, such as:  3-4 sugar tablets (glucose pills).  Glucose gel, 15-gram tube.  Fruit juice, 4 oz (120 mL).  Regular soda (not diet soda), 4 oz (120 mL).  Low-fat milk, 4 oz (120 mL).  Several pieces of hard candy.  Sugar or honey, 1  Tbsp.  Treating Hypoglycemia If You Have Diabetes  If you are alert and able to swallow safely, follow the 15:15 rule:  Take 15 grams of a rapid-acting carbohydrate. Rapid-acting options include: ? 1 tube of glucose gel. ? 3 glucose pills. ? 6-8 pieces of hard candy. ? 4 oz (120 mL) of fruit juice. ? 4 oz (120 ml) of regular (not diet) soda.  Check your blood glucose 15 minutes after you take the carbohydrate.  If the repeat blood glucose level is still at or below 70 mg/dL (3.9 mmol/L), take 15 grams of a carbohydrate again.  If your blood glucose level does not increase above 70 mg/dL (3.9 mmol/L) after 3 tries, seek emergency medical care.  After your blood glucose level returns to normal, eat a meal or a snack within 1 hour.  Treating Severe Hypoglycemia Severe hypoglycemia is when your blood glucose level is at or below 54 mg/dL (3 mmol/L). Severe hypoglycemia is an emergency. Do not wait to see if the symptoms will go away. Get medical help right away. Call your local emergency services (911 in the [place] Do not drive yourself to the hospital. If you have severe hypoglycemia and you cannot eat or drink, you may need an injection of glucagon. A family member or close friend should learn how to check your blood glucose and how to give you a glucagon injection. Ask your health care provider if you need to have an emergency glucagon injection kit available. Severe hypoglycemia may need to be treated in a hospital. The treatment may include getting glucose through an IV tube. You may also need treatment for the cause of your hypoglycemia. Follow these instructions at home: General instructions  Avoid any diets that cause you to not eat enough food. Talk with your health care provider before you start any new diet.  Take over-the-counter and prescription medicines only as told by your health care provider.  Limit alcohol intake to no more than 1 drink per day for nonpregnant women and  2 drinks per day for men. One drink equals 12 oz of beer, 5 oz of wine, or 1 oz of hard liquor.  Keep all follow-up visits as told by your health care provider. This is important. If You Have Diabetes:   Make sure you know the symptoms of hypoglycemia.  Always have a rapid-acting carbohydrate snack with you to treat low blood sugar.  Follow your diabetes management plan, as told by your health care provider. Make sure you: ? Take your medicines as directed. ? Follow your exercise plan. ? Follow your meal plan. Eat on time, and do not skip meals. ? Check your blood glucose as often as directed. Make sure to check your blood glucose before and after exercise. If you exercise longer or in a different way than usual, check your blood glucose more often. ? Follow your sick day plan whenever you cannot eat or drink normally. Make this plan in advance with your health care provider.  Share your diabetes management plan with people in your workplace, school, and household.  Check your urine for ketones when you are ill and as told by  your health care provider.  Carry a medical alert card or wear medical alert jewelry. If You Have Reactive Hypoglycemia or Low Blood Sugar From Other Causes:  Monitor your blood glucose as told by your health care provider.  Follow instructions from your health care provider about eating or drinking restrictions. Contact a health care provider if:  You have problems keeping your blood glucose in your target range.  You have frequent episodes of hypoglycemia. Get help right away if:  You continue to have hypoglycemia symptoms after eating or drinking something containing glucose.  Your blood glucose is at or below 54 mg/dL (3 mmol/L).  You have a seizure.  You faint. These symptoms may represent a serious problem that is an emergency. Do not wait to see if the symptoms will go away. Get medical help right away. Call your local emergency services (911 in  the [place] Do not drive yourself to the hospital. This information is not intended to replace advice given to you by your health care provider. Make sure you discuss any questions you have with your health care provider. Document Released: 04/09/2005 Document Revised: 09/21/2015 Document Reviewed: 05/13/2015 Elsevier Interactive Patient Education  2018 [employer]. Glucagon injection What is this medicine? GLUCAGON (GLOO ka gon) occurs naturally in the body. It increases blood sugar. This medicine is used as an emergency treatment for severely low blood sugar in diabetic patients, especially if they are not able to take sugar by mouth. It is also used as a diagnostic aid in X-ray examinations of the stomach and other digestive organs. This medicine may be used for other purposes; ask your health care provider or pharmacist if you have questions. COMMON BRAND NAME(S): GlucaGen, Glucagon What should I tell my health care provider before I take this medicine? They need to know if you have any of these conditions: -pancreatic tumors -pheochromocytoma -an unusual or allergic reaction to glucagon, other medicines, foods, dyes, or preservatives -pregnant or trying to get pregnant -breast-feeding How should I use this medicine? This medicine is for injection into a muscle. You will be taught how to prepare and give this medicine. Instructions for mixing and giving the injection are included in the package. Before an emergency arises, you and the person(s) most likely to give you the injection should read these instructions carefully. Use exactly as directed. Do not take your medicine more often than directed. It is important that you put your used needles and syringes in a special sharps container. Do not put them in a trash can. If you do not have a sharps container, call your pharmacist or healthcare provider to get one. Talk to your pediatrician regarding the use of this medicine in children. While  this medicine may be prescribed for selected conditions, precautions do apply. Overdosage: If you think you have taken too much of this medicine contact a poison control center or emergency room at once. NOTE: This medicine is only for you. Do not share this medicine with others. What if I miss a dose? This does not apply. What may interact with this medicine? This medicine is only used during an emergency. Significant drug interactions are not likely during that time. This list may not describe all possible interactions. Give your health care provider a list of all the medicines, herbs, non-prescription drugs, or dietary supplements you use. Also tell them if you smoke, drink alcohol, or use illegal drugs. Some items may interact with your medicine. What should I watch for while using this  medicine? If you often have periods of low blood sugar, keep this kit with you at all times. Wear a medical identification bracelet or chain to say you have diabetes, and carry a card that lists all your medications. Show your family members and others where you keep this kit and how to use it. They need to know how to use it before you need it. They can practice by giving you your normal insulin shots. It is important that they practice. A person who has never given you a shot will probably not be able to do it in an emergency. Symptoms of low blood sugar vary from person to person. Learn to recognize your own. They can include: confusion, cool, pale skin or cold sweats, drowsiness, extreme hunger, fast heartbeat, headache, nausea, vomiting, nervousness or anxiety, shakiness or unsteadiness, tiredness, weakness, or visual changes. Eat or drink something sweet (fruit juice, honey, soft drinks, sugar or sugar water, or syrup) if you get these symptoms. If you do not feel better, ask someone to help you get to a doctor, health care professional or emergency room right away. Do not attempt to drive yourself. Also, remind  the person that he/she may need to give you a glucagon injection before medical treatment is available. After a response to an injection of glucagon, you should eat or drink some carbohydrates to prevent secondary hypoglycemia. What side effects may I notice from receiving this medicine? Side effects that you should report to your doctor or health care professional as soon as possible: -chest pain or fast, irregular heartbeat -difficulty breathing -dizziness or light headedness -muscle cramps -unusual weakness Side effects that usually do not require medical attention (report to your doctor or health care professional if they continue or are bothersome): -nausea, vomiting -rash, itching This list may not describe all possible side effects. Call your doctor for medical advice about side effects. You may report side effects to FDA at 1-800-FDA-1088. Where should I keep my medicine? Keep out of the reach of children. Store at room temperature between 20 and 25 degrees C (68 and 77 degrees F) before mixing the solution. After dissolving the powder in the diluting solution, use it immediately. Do not store for later use. Throw away any unused solution. Throw away the kit after the expiration date. NOTE: This sheet is a summary. It may not cover all possible information. If you have questions about this medicine, talk to your doctor, pharmacist, or health care provider.  2018 Elsevier/Gold Standard (2007-08-20 10:39:25)

## 2017-08-21 NOTE — Progress Notes (Signed)
Chief Complaint  Patient presents with  . Diabetes    f/u    HPI   Diabetes Mellitus: Patient presents for follow up of diabetes. Symptoms: polyuria. Symptoms have stabilized. Patient denies hypoglycemia , nausea, paresthesia of the feet and vomitting.  Evaluation to date has been included: hemoglobin A1C.  Home sugars: patient does not check sugars.  Lab Results  Component Value Date   HGBA1C 12.9 08/21/2017   Wt Readings from Last 3 Encounters:  08/21/17 217 lb 6.4 oz (98.6 kg)  07/22/17 218 lb 12.8 oz (99.2 kg)  06/20/17 216 lb 12.8 oz (98.3 kg)   He reports that he has "been eating off and on the road" due to his wife being in the hospital due to pancreatitis He states that in the past month he has had to rely on fast food and was eating on the go He denies exercise He is taking [place], glipizide and metformin He reports that he was previously on trulicity and was able to get his a1c down on that medication.  He denies any side effects while on Trulicity. It was stopped once his a1c was less than 8%.  Hypertension: Patient here for follow-up of elevated blood pressure. He is not exercising and is not adherent to low salt diet.  Blood pressure is well controlled at home. Cardiac symptoms none. Patient denies chest pain, chest pressure/discomfort, exertional chest pressure/discomfort, irregular heart beat, lower extremity edema, near-syncope, orthopnea and palpitations.  Cardiovascular risk factors: diabetes mellitus, dyslipidemia, hypertension, male gender, obesity (BMI >= 30 kg/m2) and sedentary lifestyle. Use of agents associated with hypertension: none. History of target organ damage: none. BP Readings from Last 3 Encounters:  08/21/17 138/86  07/22/17 117/76  06/20/17 123/78      Past Medical History:  Diagnosis Date  . Diabetes mellitus   . Hypertension   . Seasonal allergies     Current Outpatient Medications  Medication Sig Dispense Refill  . FARXIGA 10 MG TABS  tablet TAKE 1 TABLET BY MOUTH EVERY DAY 90 tablet 1  . lisinopril-hydrochlorothiazide (PRINZIDE,ZESTORETIC) 20-12.5 MG tablet TAKE ONE DAILY FOR BLOOD PRESSURE 30 tablet 6  . metFORMIN (GLUCOPHAGE) 1000 MG tablet Take 1 tablet (1,000 mg total) by mouth 2 (two) times daily with Zachary meal. 180 tablet 1  . sertraline (ZOLOFT) 50 MG tablet Take 0.5 tablets (25 mg total) by mouth daily. 45 tablet 3  . aspirin 81 MG chewable tablet Chew 81 mg by mouth daily.    Marland Kitchen atorvastatin (LIPITOR) 20 MG tablet Take 1 tablet (20 mg total) by mouth daily. (Patient not taking: Reported on 07/22/2017) 90 tablet 3  . Continuous Blood Gluc Receiver (FREESTYLE LIBRE 14 DAY READER) DEVI 1 Device by Does not apply route 3 (three) times daily. E11.65 Use for continuous blood glucose monitoring (Patient not taking: Reported on 08/21/2017) 1 Device 0  . Continuous Blood Gluc Sensor (FREESTYLE LIBRE 14 DAY SENSOR) MISC 1 Device by Does not apply route 3 (three) times daily. E11.65 (Patient not taking: Reported on 08/21/2017) 2 each 6  . Dulaglutide (TRULICITY) 1.5 MG/0.5ML SOPN Inject 1.5 mg into the skin once Zachary week. 30 pen 0  . glucagon 1 MG injection Inject 1 mg into the vein once as needed for up to 1 dose. 1 each 12  . nitroGLYCERIN (NITROSTAT) 0.4 MG SL tablet Place 1 tablet (0.4 mg total) under the tongue every 5 (five) minutes as needed for chest pain. (Patient not taking: Reported on 08/21/2017) 10 tablet 0  .  tadalafil (CIALIS) 5 MG tablet Take 1 tablet (5 mg total) by mouth daily as needed for erectile dysfunction. (Patient not taking: Reported on 08/21/2017) 10 tablet 0   No current facility-administered medications for this visit.     Allergies:  Allergies  Allergen Reactions  . Benadryl [Diphenhydramine Hcl] Anxiety    Past Surgical History:  Procedure Laterality Date  . HERNIA REPAIR    . right leg fracture      Social History   Socioeconomic History  . Marital status: Married    Spouse name: Not on file  .  Number of children: Not on file  . Years of education: Not on file  . Highest education level: Not on file  Occupational History  . Not on file  Social Needs  . Financial resource strain: Not on file  . Food insecurity:    Worry: Not on file    Inability: Not on file  . Transportation needs:    Medical: Not on file    Non-medical: Not on file  Tobacco Use  . Smoking status: Never Smoker  . Smokeless tobacco: Never Used  Substance and Sexual Activity  . Alcohol use: No  . Drug use: No  . Sexual activity: Not on file  Lifestyle  . Physical activity:    Days per week: Not on file    Minutes per session: Not on file  . Stress: Not on file  Relationships  . Social connections:    Talks on phone: Not on file    Gets together: Not on file    Attends religious service: Not on file    Active member of club or organization: Not on file    Attends meetings of clubs or organizations: Not on file    Relationship status: Not on file  Other Topics Concern  . Not on file  Social History Narrative  . Not on file    Family History  Problem Relation Age of Onset  . Heart disease Mother   . Heart failure Mother   . Heart disease Father   . Diabetes Father   . Heart failure Father      ROS Review of Systems See HPI Constitution: No fevers or chills No malaise No diaphoresis Skin: No rash or itching Eyes: no blurry vision, no double vision GU: no dysuria or hematuria Neuro: no dizziness or headaches  all others reviewed and negative   Objective: Vitals:   08/21/17 0900  BP: 138/86  Pulse: 84  Resp: 17  Temp: 98.1 F (36.7 C)  TempSrc: Oral  SpO2: 96%  Weight: 217 lb 6.4 oz (98.6 kg)  Height:  (1.778 m)    Physical Exam  Constitutional: He is oriented to person, place, and time. He appears well-developed and well-nourished.  HENT:  Head: Normocephalic and atraumatic.  Eyes: Conjunctivae and EOM are normal.  Neck: Normal range of motion. Neck supple.    Cardiovascular: Normal rate, regular rhythm and normal heart sounds.  No murmur heard. Pulmonary/Chest: Effort normal and breath sounds normal. No stridor. No respiratory distress. He has no wheezes. He has no rales. He exhibits no tenderness.  Abdominal: Soft. Bowel sounds are normal. He exhibits no distension and no mass. There is no tenderness. There is no rebound and no guarding. No hernia.  Musculoskeletal: Normal range of motion. He exhibits no edema.  Neurological: He is alert and oriented to person, place, and time.  Skin: Skin is warm. Capillary refill takes less than 2 seconds.  Psychiatric: He has Zachary normal mood and affect. His behavior is normal. Judgment and thought content normal.    Assessment and Plan Zachary Walsh was seen today for diabetes.  Diagnoses and all orders for this visit:  Type 2 diabetes mellitus with complication, unspecified whether long term insulin use (HCC) - Discussed trulicity - Discontinued glipizide xl - Discussed hypoglycemia - Sent in glucagon pen for emergency use -     POCT glycosylated hemoglobin (Hb A1C) -     POCT urinalysis dipstick -     Dulaglutide (TRULICITY) 1.5 MG/0.5ML SOPN; Inject 1.5 mg into the skin once Zachary week.  Essential hypertension- bp at goal, cpm  Class 1 obesity due to excess calories with serious comorbidity and body mass index (BMI) of 31.0 to 31.9 in adult- stabilizing - Discussed short bursts of exercise more often like walking around the parking lots  Other orders -     glucagon 1 MG injection; Inject 1 mg into the vein once as needed for up to 1 dose.   Zachary total of 30 minutes were spent face-to-face with the patient during this encounter and over half of that time was spent on counseling and coordination of care.   Zachary Walsh Zachary Walsh

## 2017-08-28 ENCOUNTER — Encounter: Payer: Self-pay | Admitting: Family Medicine

## 2017-09-15 ENCOUNTER — Other Ambulatory Visit: Payer: Self-pay | Admitting: Family Medicine

## 2017-09-15 DIAGNOSIS — E119 Type 2 diabetes mellitus without complications: Secondary | ICD-10-CM

## 2017-09-23 ENCOUNTER — Other Ambulatory Visit: Payer: Self-pay

## 2017-09-23 ENCOUNTER — Encounter: Payer: Self-pay | Admitting: Physician Assistant

## 2017-09-23 ENCOUNTER — Ambulatory Visit (INDEPENDENT_AMBULATORY_CARE_PROVIDER_SITE_OTHER): Payer: 59 | Admitting: Physician Assistant

## 2017-09-23 DIAGNOSIS — F4321 Adjustment disorder with depressed mood: Secondary | ICD-10-CM

## 2017-09-23 DIAGNOSIS — F330 Major depressive disorder, recurrent, mild: Secondary | ICD-10-CM

## 2017-09-23 MED ORDER — BUSPIRONE HCL 10 MG PO TABS: 5.0000 mg | ORAL_TABLET | Freq: Three times a day (TID) | ORAL | 0 refills | Status: DC

## 2017-09-23 MED ORDER — SERTRALINE HCL 50 MG PO TABS: 50.0000 mg | ORAL_TABLET | Freq: Every day | ORAL | 3 refills | Status: DC

## 2017-09-23 NOTE — Progress Notes (Signed)
09/24/2017 4:49 PM   DOB: 12/04/1960 / MRN: [id number]  SUBJECTIVE:  Zachary Walsh is a 57 y.o. male presenting for acute stress reaction and worsening dysthymia.  Pt lost his wife in the spring.  Associates dysthymia, anhedonia, poor sleep, and some crying spells.  These are getting worse.  He denies SI/HI and loves his two young daughters. He has a history of dysthymia and takes 12.5 sertraline daily however has recently increased this to 25 on his own.  He has seen mild improvement with this.   He is allergic to benadryl [diphenhydramine hcl].   He  has a past medical history of Diabetes mellitus, Hypertension, and Seasonal allergies.    He  reports that he has never smoked. He has never used smokeless tobacco. He reports that he does not drink alcohol or use drugs. He  has no sexual activity history on file. The patient  has a past surgical history that includes right leg fracture and Hernia repair.  His family history includes Diabetes in his father; Heart disease in his father and mother; Heart failure in his father and mother.  Review of Systems  Constitutional: Positive for malaise/fatigue. Negative for chills, diaphoresis and fever.  Gastrointestinal: Negative for nausea.  Skin: Negative for rash.  Neurological: Negative for dizziness.  Psychiatric/Behavioral: Positive for depression. Negative for hallucinations, memory loss, substance abuse and suicidal ideas. The patient has insomnia. The patient is not nervous/anxious.     The problem list and medications were reviewed and updated by myself where necessary and exist elsewhere in the encounter.   OBJECTIVE:  There were no vitals taken for this visit.  Physical Exam  Constitutional: He is oriented to person, place, and time. He appears well-developed. He does not appear ill.  Eyes: Pupils are equal, round, and reactive to light. Conjunctivae and EOM are normal.  Cardiovascular: Normal rate.  Pulmonary/Chest: Effort normal.   Abdominal: He exhibits no distension.  Musculoskeletal: Normal range of motion.  Neurological: He is alert and oriented to person, place, and time. No cranial nerve deficit. Coordination normal.  Skin: Skin is warm and dry. He is not diaphoretic.  Psychiatric: His behavior is normal. Judgment and thought content normal. His mood appears not anxious. His affect is not angry, not blunt, not labile and not inappropriate. His speech is not rapid and/or pressured. Cognition and memory are normal. He exhibits a depressed mood.  Tearful for much of the interview.   Nursing note and vitals reviewed.   No results found for this or any previous visit (from the past 72 hour(s)).  No results found.  ASSESSMENT AND PLAN:  Grace was seen today for medication refill.  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 Increasing sertraline to 50. Buspar for any acute panic symptoms. Pt plans to talk to elders in the church for emotional support.  I encouraged this.   -     sertraline (ZOLOFT) 50 MG tablet; Take 1 tablet (50 mg total) by mouth daily.  Grief reaction -     busPIRone (BUSPAR) 10 MG tablet; Take 0.5-1 tablets (5-10 mg total) by mouth 3 (three) times daily.    The patient is advised to call or return to clinic if he does not see an improvement in symptoms, or to seek the care of the closest emergency department if he worsens with the above plan.   Deliah Boston, MHS, PA-C Primary Care at [facility]  [facility] 09/24/2017 4:49 PM

## 2017-09-23 NOTE — Patient Instructions (Addendum)
Come back in 6 weeks. Call if you change your mind about counseling.  Let me know if there is anything that I can do for you in the meantime.     IF you received an x-ray today, you will receive an invoice from [facility]. Please contact [facility] at [phone] with questions or concerns regarding your invoice.   IF you received labwork today, you will receive an invoice from [facility]. Please contact [facility] at [phone] with questions or concerns regarding your invoice.   Our billing staff will not be able to assist you with questions regarding bills from these companies.  You will be contacted with the lab results as soon as they are available. The fastest way to get your results is to activate your My Chart account. Instructions are located on the last page of this paperwork. If you have not heard from us regarding the results in 2 weeks, please contact this office.

## 2017-09-29 ENCOUNTER — Other Ambulatory Visit: Payer: Self-pay | Admitting: Family Medicine

## 2017-09-29 DIAGNOSIS — E119 Type 2 diabetes mellitus without complications: Secondary | ICD-10-CM

## 2017-09-30 NOTE — Telephone Encounter (Signed)
glipizide refill Last Refill:not on med list as an active med   Last OV: dc'd 08/21/17  PCP: Dr Creta LevinStallings

## 2017-10-13 ENCOUNTER — Ambulatory Visit (HOSPITAL_COMMUNITY)
Admission: EM | Admit: 2017-10-13 | Discharge: 2017-10-13 | Disposition: A | Discharge status: Home / Self Care | Payer: 59 | Attending: Family Medicine | Admitting: Family Medicine

## 2017-10-13 ENCOUNTER — Other Ambulatory Visit: Payer: Self-pay

## 2017-10-13 ENCOUNTER — Encounter (HOSPITAL_COMMUNITY): Payer: Self-pay

## 2017-10-13 DIAGNOSIS — M75101 Unspecified rotator cuff tear or rupture of right shoulder, not specified as traumatic: Secondary | ICD-10-CM

## 2017-10-13 MED ORDER — NAPROXEN 500 MG PO TABS: 500.0000 mg | ORAL_TABLET | Freq: Two times a day (BID) | ORAL | 0 refills | Status: DC | PRN

## 2017-10-13 NOTE — ED Provider Notes (Signed)
[facility]   [id number] 10/13/17 Arrival Time: 1953  SUBJECTIVE: History from: patient. Zachary Walsh is a 57 y.o. male hx of DM complains of R>L bilateral shoulder pain that began 2 weeks ago.  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  Localizes the pain to the superior and lateral aspect.  Describes the pain as constant and achy/throbbing in character.  Has tried icy hot with temporary relief.  Symptoms are made worse with overhead activities.  Reports similar symptoms in the past.  Denies fever, chills, erythema, ecchymosis, effusion, weakness, numbness and tingling.      ROS: As per HPI.  Past Medical History:  Diagnosis Date  . Diabetes mellitus   . Hypertension   . Seasonal allergies    Past Surgical History:  Procedure Laterality Date  . HERNIA REPAIR    . right leg fracture     Allergies  Allergen Reactions  . Benadryl [Diphenhydramine Hcl] Anxiety   No current facility-administered medications on file prior to encounter.    Current Outpatient Medications on File Prior to Encounter  Medication Sig Dispense Refill  . Dulaglutide (TRULICITY) 1.5 MG/0.5ML SOPN Inject 1.5 mg into the skin once a week. 30 pen 0  . FARXIGA 10 MG TABS tablet TAKE 1 TABLET BY MOUTH EVERY DAY 90 tablet 1  . lisinopril-hydrochlorothiazide (PRINZIDE,ZESTORETIC) 20-12.5 MG tablet TAKE ONE DAILY FOR BLOOD PRESSURE 30 tablet 6  . metFORMIN (GLUCOPHAGE) 1000 MG tablet Take 1 tablet (1,000 mg total) by mouth 2 (two) times daily with a meal. 180 tablet 1  . sertraline (ZOLOFT) 50 MG tablet Take 1 tablet (50 mg total) by mouth daily. 30 tablet 3  . aspirin 81 MG chewable tablet Chew 81 mg by mouth daily.    Marland Kitchen atorvastatin (LIPITOR) 20 MG tablet Take 1 tablet (20 mg total) by mouth daily. (Patient not taking: Reported on 07/22/2017) 90 tablet 3  . busPIRone (BUSPAR) 10 MG tablet Take 0.5-1 tablets (5-10 mg total) by mouth 3 (three)  times daily. 60 tablet 0  . Continuous Blood Gluc Receiver (FREESTYLE LIBRE 14 DAY READER) DEVI 1 Device by Does not apply route 3 (three) times daily. E11.65 Use for continuous blood glucose monitoring (Patient not taking: Reported on 08/21/2017) 1 Device 0  . Continuous Blood Gluc Sensor (FREESTYLE LIBRE 14 DAY SENSOR) MISC 1 Device by Does not apply route 3 (three) times daily. E11.65 (Patient not taking: Reported on 08/21/2017) 2 each 6  . glucagon 1 MG injection Inject 1 mg into the vein once as needed for up to 1 dose. 1 each 12  . nitroGLYCERIN (NITROSTAT) 0.4 MG SL tablet Place 1 tablet (0.4 mg total) under the tongue every 5 (five) minutes as needed for chest pain. (Patient not taking: Reported on 08/21/2017) 10 tablet 0  . tadalafil (CIALIS) 5 MG tablet Take 1 tablet (5 mg total) by mouth daily as needed for erectile dysfunction. (Patient not taking: Reported on 08/21/2017) 10 tablet 0   Social History   Socioeconomic History  . Marital status: Married    Spouse name: Not on file  . Number of children: Not on file  . Years of education: Not on file  . Highest education level: Not on file  Occupational History  . Not on file  Social Needs  . Financial resource strain: Not on file  . Food insecurity:    Worry: Not on file    Inability: Not on file  .  Transportation needs:    Medical: Not on file    Non-medical: Not on file  Tobacco Use  . Smoking status: Never Smoker  . Smokeless tobacco: Never Used  Substance and Sexual Activity  . Alcohol use: No  . Drug use: No  . Sexual activity: Not on file  Lifestyle  . Physical activity:    Days per week: Not on file    Minutes per session: Not on file  . Stress: Not on file  Relationships  . Social connections:    Talks on phone: Not on file    Gets together: Not on file    Attends religious service: Not on file    Active member of club or organization: Not on file    Attends meetings of clubs or organizations: Not on file     Relationship status: Not on file  . Intimate partner violence:    Fear of current or ex partner: Not on file    Emotionally abused: Not on file    Physically abused: Not on file    Forced sexual activity: Not on file  Other Topics Concern  . Not on file  Social History Narrative  . Not on file   Family History  Problem Relation Age of Onset  . Heart disease Mother   . Heart failure Mother   . Heart disease Father   . Diabetes Father   . Heart failure Father     OBJECTIVE:  Vitals:   10/13/17 2000  BP: (!) 144/87  Pulse: 72  Resp: 17  Temp: 98.4 F (36.9 C)  TempSrc: Oral  SpO2: 97%    General appearance: AOx3; in no acute distress.  Head: NCAT Lungs: CTA bilaterally Heart: RRR.  Clear S1 and S2 without murmur, gallops, or rubs.  Radial pulses 2+ bilaterally. Musculoskeletal: Right shoulder Inspection: Skin warm, dry, clear and intact without obvious erythema, effusion, or ecchymosis.  Palpation: mildly tender about the subacromial space and lateral deltoid ROM: 0-90 passive ROM; 0-145 active ROM with crepitus  Strength: 4/5 shld abduction, 5/5 shld adduction, 5/5 elbow flexion, 5/5 elbow extension, 5/5 grip strength Decreased strength with  empty can; decreased strength with Lift-off Skin: warm and dry Neurologic: Ambulates without difficulty; Sensation intact about the upper extremities Psychological: alert and cooperative; normal mood and affect  ASSESSMENT & PLAN:  1. Rotator cuff syndrome of right shoulder     Meds ordered this encounter  Medications  . naproxen (NAPROSYN) 500 MG tablet    Sig: Take 1 tablet (500 mg total) by mouth 2 (two) times daily as needed.    Dispense:  30 tablet    Refill:  0    Order Specific Question:   Supervising Provider    Answer:   Isa RankinMURRAY, LAURA WILSON [[id number]]    Continue conservative management of rest, ice, heat, and gentle stretches (wall crawls, and pendulum swings) Take naproxen as needed for pain relief (may cause  abdominal discomfort, ulcers, and GI bleeds avoid taking with other NSAIDs) Follow up with PCP if symptoms persist Return or go to the ER if you have any new or worsening symptoms (fever, chills, chest pain, abdominal pain, changes in bowel or bladder habits, pain radiating into lower legs, etc...)   Reviewed expectations re: course of current medical issues. Questions answered. Outlined signs and symptoms indicating need for more acute intervention. Patient verbalized understanding. After Visit Summary given.    Rennis HardingWurst, [place], PA-C 10/13/17 2026

## 2017-10-13 NOTE — ED Triage Notes (Signed)
[facility] pain x2 weeks, pt denies any injuries

## 2017-10-13 NOTE — Discharge Instructions (Signed)
Continue conservative management of rest, ice, heat, and gentle stretches (wall crawls, and pendulum swings) Take naproxen as needed for pain relief (may cause abdominal discomfort, ulcers, and GI bleeds avoid taking with other NSAIDs) Follow up with PCP if symptoms persist Return or go to the ER if you have any new or worsening symptoms (fever, chills, chest pain, abdominal pain, changes in bowel or bladder habits, pain radiating into lower legs, etc...)

## 2017-12-02 ENCOUNTER — Ambulatory Visit (HOSPITAL_COMMUNITY)
Admission: EM | Admit: 2017-12-02 | Discharge: 2017-12-02 | Disposition: A | Discharge status: Home / Self Care | Payer: 59 | Attending: Family Medicine | Admitting: Family Medicine

## 2017-12-02 ENCOUNTER — Other Ambulatory Visit: Payer: Self-pay

## 2017-12-02 ENCOUNTER — Encounter (HOSPITAL_COMMUNITY): Payer: Self-pay | Admitting: Emergency Medicine

## 2017-12-02 DIAGNOSIS — H00011 Hordeolum externum right upper eyelid: Secondary | ICD-10-CM | POA: Diagnosis not present

## 2017-12-02 MED ORDER — POLYMYXIN B-TRIMETHOPRIM 10000-0.1 UNIT/ML-% OP SOLN: 1.0000 [drp] | OPHTHALMIC | 0 refills | Status: DC

## 2017-12-02 NOTE — ED Triage Notes (Signed)
Pt reports a stye that developed on his right eyelid about a week ago.  He has been putting hot compresses on it.

## 2017-12-02 NOTE — Discharge Instructions (Signed)
It was nice meeting you!!  We will go ahead and treat with some antibiotic drops to see if this helps. Keep doing the warm compresses.  If no improvement follow up with optometry for possibly further treatment.
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
## 2017-12-11 ENCOUNTER — Ambulatory Visit (HOSPITAL_COMMUNITY)
Admission: EM | Admit: 2017-12-11 | Discharge: 2017-12-11 | Disposition: A | Discharge status: Home / Self Care | Payer: 59 | Attending: Family Medicine | Admitting: Family Medicine

## 2017-12-11 ENCOUNTER — Encounter (HOSPITAL_COMMUNITY): Payer: Self-pay | Admitting: Emergency Medicine

## 2017-12-11 DIAGNOSIS — B3742 Candidal balanitis: Secondary | ICD-10-CM

## 2017-12-11 MED ORDER — FLUCONAZOLE 150 MG PO TABS: 150.0000 mg | ORAL_TABLET | Freq: Every day | ORAL | 0 refills | Status: DC

## 2017-12-11 MED ORDER — NYSTATIN 100000 UNIT/GM EX CREA: TOPICAL_CREAM | CUTANEOUS | 0 refills | Status: DC
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
## 2017-12-11 NOTE — Discharge Instructions (Addendum)
It was nice meeting you!!  We will treat you for a yeast infection.  Follow up as needed for continued or worsening symptoms

## 2017-12-11 NOTE — ED Triage Notes (Signed)
Pt c/o swelling and itching, pt states hes not circumcised and hes noticed some swelling and itching under his foreskin. x3-4 days. Denies issues with urination.

## 2017-12-13 ENCOUNTER — Other Ambulatory Visit: Payer: Self-pay | Admitting: Family Medicine

## 2017-12-13 DIAGNOSIS — I10 Essential (primary) hypertension: Secondary | ICD-10-CM

## 2017-12-13 NOTE — Telephone Encounter (Signed)
Lisinopril-HCTZ 20-12.5mg   refill Last Refill:05/23/17 # 30 and 6 refills Last OV: 09/23/17 PCP: Laurence AlyZ. Stallings Pharmacy: [facility] [phone]#[id number]

## 2018-01-10 NOTE — Telephone Encounter (Signed)
Error

## 2018-01-13 ENCOUNTER — Ambulatory Visit (HOSPITAL_COMMUNITY)
Admission: EM | Admit: 2018-01-13 | Discharge: 2018-01-13 | Disposition: A | Discharge status: Home / Self Care | Payer: 59 | Attending: Family Medicine | Admitting: Family Medicine

## 2018-01-13 ENCOUNTER — Encounter (HOSPITAL_COMMUNITY): Payer: Self-pay | Admitting: Family Medicine

## 2018-01-13 DIAGNOSIS — M25512 Pain in left shoulder: Secondary | ICD-10-CM | POA: Diagnosis not present

## 2018-01-13 DIAGNOSIS — M19039 Primary osteoarthritis, unspecified wrist: Secondary | ICD-10-CM | POA: Diagnosis not present

## 2018-01-13 MED ORDER — DICLOFENAC SODIUM 75 MG PO TBEC: 75.0000 mg | DELAYED_RELEASE_TABLET | Freq: Two times a day (BID) | ORAL | 0 refills | Status: DC
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
## 2018-01-13 NOTE — ED Triage Notes (Signed)
Pt here for left shoulder and left wrist pain with hx of same in past worse x 4 days

## 2018-02-11 ENCOUNTER — Encounter (HOSPITAL_COMMUNITY): Payer: Self-pay | Admitting: Emergency Medicine

## 2018-02-11 ENCOUNTER — Ambulatory Visit (HOSPITAL_COMMUNITY)
Admission: EM | Admit: 2018-02-11 | Discharge: 2018-02-11 | Disposition: A | Discharge status: Home / Self Care | Payer: 59 | Attending: Family Medicine | Admitting: Family Medicine

## 2018-02-11 DIAGNOSIS — K625 Hemorrhage of anus and rectum: Secondary | ICD-10-CM

## 2018-02-11 DIAGNOSIS — F330 Major depressive disorder, recurrent, mild: Secondary | ICD-10-CM

## 2018-02-11 DIAGNOSIS — I10 Essential (primary) hypertension: Secondary | ICD-10-CM

## 2018-02-11 DIAGNOSIS — Z760 Encounter for issue of repeat prescription: Secondary | ICD-10-CM

## 2018-02-11 MED ORDER — SERTRALINE HCL 50 MG PO TABS: 50.0000 mg | ORAL_TABLET | Freq: Every day | ORAL | 3 refills | Status: DC

## 2018-02-11 MED ORDER — LISINOPRIL-HYDROCHLOROTHIAZIDE 20-12.5 MG PO TABS
ORAL_TABLET | ORAL | 2 refills | Status: DC
Start: 2018-02-11 — End: 2018-06-03

## 2018-02-11 NOTE — ED Triage Notes (Signed)
PT passed a BM last night and has some bright red bleeding. PT had two BMS this morning with no signs of bleeding.  PT also needs meds refilled. Is requesting a sertratline and lisinopril refill.
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
## 2018-02-11 NOTE — Discharge Instructions (Addendum)
I believe the rectal bleeding was from a hard stool. There is no obvious external hemorrhoid I am refilling your medications I will give you a contact for primary care provider

## 2018-02-21 ENCOUNTER — Other Ambulatory Visit: Payer: Self-pay | Admitting: Endocrinology

## 2018-02-21 NOTE — Telephone Encounter (Signed)
Please advise if you are okay with resuming this Rx. I will be happy to refill. Thanks

## 2018-02-21 NOTE — Telephone Encounter (Signed)
Please refill x 1 Ov is due  

## 2018-03-23 ENCOUNTER — Emergency Department (HOSPITAL_COMMUNITY): Payer: 59

## 2018-03-23 ENCOUNTER — Emergency Department (HOSPITAL_COMMUNITY)
Admission: EM | Admit: 2018-03-23 | Discharge: 2018-03-23 | Disposition: A | Discharge status: Home / Self Care | Payer: 59 | Attending: Emergency Medicine | Admitting: Emergency Medicine

## 2018-03-23 ENCOUNTER — Encounter (HOSPITAL_COMMUNITY): Payer: Self-pay | Admitting: Emergency Medicine

## 2018-03-23 ENCOUNTER — Other Ambulatory Visit: Payer: Self-pay

## 2018-03-23 DIAGNOSIS — Z7984 Long term (current) use of oral hypoglycemic drugs: Secondary | ICD-10-CM | POA: Insufficient documentation

## 2018-03-23 DIAGNOSIS — I10 Essential (primary) hypertension: Secondary | ICD-10-CM | POA: Diagnosis not present

## 2018-03-23 DIAGNOSIS — I951 Orthostatic hypotension: Secondary | ICD-10-CM | POA: Insufficient documentation

## 2018-03-23 DIAGNOSIS — Z7982 Long term (current) use of aspirin: Secondary | ICD-10-CM | POA: Diagnosis not present

## 2018-03-23 DIAGNOSIS — Z79899 Other long term (current) drug therapy: Secondary | ICD-10-CM | POA: Insufficient documentation

## 2018-03-23 DIAGNOSIS — R739 Hyperglycemia, unspecified: Secondary | ICD-10-CM

## 2018-03-23 DIAGNOSIS — E1165 Type 2 diabetes mellitus with hyperglycemia: Secondary | ICD-10-CM | POA: Insufficient documentation

## 2018-03-23 DIAGNOSIS — R55 Syncope and collapse: Secondary | ICD-10-CM | POA: Diagnosis present

## 2018-03-23 LAB — BASIC METABOLIC PANEL
Anion gap: 8 (ref 5–15)
BUN: 17 mg/dL (ref 6–20)
CO2: 26 mmol/L (ref 22–32)
CREATININE: 1.2 mg/dL (ref 0.61–1.24)
Calcium: 9.9 mg/dL (ref 8.9–10.3)
Chloride: 97 mmol/L — ABNORMAL LOW (ref 98–111)
GFR calc Af Amer: >60 mL/min (ref >60)
GFR calc non Af Amer: >60 mL/min (ref >60)
GLUCOSE: 341 mg/dL — AB (ref 70–99)
POTASSIUM: 3.9 mmol/L (ref 3.5–5.1)
SODIUM: 131 mmol/L — AB (ref 135–145)

## 2018-03-23 LAB — CBC WITH DIFFERENTIAL/PLATELET
ABS IMMATURE GRANULOCYTES: 0.03 10*3/uL (ref 0.00–0.07)
Basophils Absolute: 0 10*3/uL (ref 0.0–0.1)
Basophils Relative: 0 %
EOS PCT: 2 %
Eosinophils Absolute: 0.1 10*3/uL (ref 0.0–0.5)
HEMATOCRIT: 42.8 % (ref 39.0–52.0)
HEMOGLOBIN: 14 g/dL (ref 13.0–17.0)
Immature Granulocytes: 0 %
LYMPHS PCT: 27 %
Lymphs Abs: 2 10*3/uL (ref 0.7–4.0)
MCH: 30.2 pg (ref 26.0–34.0)
MCHC: 32.7 g/dL (ref 30.0–36.0)
MCV: 92.2 fL (ref 80.0–100.0)
MONO ABS: 0.6 10*3/uL (ref 0.1–1.0)
MONOS PCT: 8 %
Neutro Abs: 4.6 10*3/uL (ref 1.7–7.7)
Neutrophils Relative %: 63 %
Platelets: 205 10*3/uL (ref 150–400)
RBC: 4.64 MIL/uL (ref 4.22–5.81)
RDW: 12.4 % (ref 11.5–15.5)
WBC: 7.3 10*3/uL (ref 4.0–10.5)
nRBC: 0 % (ref 0.0–0.2)

## 2018-03-23 LAB — I-STAT TROPONIN, ED: Troponin i, poc: 0.01 ng/mL (ref 0.00–0.08)

## 2018-03-23 LAB — CBG MONITORING, ED: GLUCOSE-CAPILLARY: 250 mg/dL — AB (ref 70–99)

## 2018-03-23 MED ORDER — SODIUM CHLORIDE 0.9 % IV BOLUS
1000.0000 mL | Freq: Once | INTRAVENOUS | Status: AC
  Administered 2018-03-23: 1000 mL via INTRAVENOUS

## 2018-03-23 MED ORDER — SODIUM CHLORIDE 0.9 % IV SOLN: INTRAVENOUS | Status: DC

## 2018-03-23 NOTE — ED Notes (Signed)
Pt discharge instructions given. Pt verbalized understanding.  

## 2018-03-23 NOTE — ED Notes (Signed)
Patient transported to CT 
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
## 2018-03-23 NOTE — ED Triage Notes (Signed)
Per [facility] pt had a syncopal episode approximately one min long, stood up and then had a second syncopal episode. Patient alert and orientated x 4. Denies hitting head. Wife at bedside.

## 2018-03-27 ENCOUNTER — Other Ambulatory Visit: Payer: Self-pay

## 2018-03-27 ENCOUNTER — Ambulatory Visit (HOSPITAL_COMMUNITY)
Admission: EM | Admit: 2018-03-27 | Discharge: 2018-03-27 | Disposition: A | Discharge status: Home / Self Care | Payer: 59 | Attending: Nurse Practitioner | Admitting: Nurse Practitioner

## 2018-03-27 ENCOUNTER — Encounter (HOSPITAL_COMMUNITY): Payer: Self-pay

## 2018-03-27 DIAGNOSIS — B3742 Candidal balanitis: Secondary | ICD-10-CM

## 2018-03-27 DIAGNOSIS — Z09 Encounter for follow-up examination after completed treatment for conditions other than malignant neoplasm: Secondary | ICD-10-CM

## 2018-03-27 MED ORDER — FLUCONAZOLE 150 MG PO TABS: 150.0000 mg | ORAL_TABLET | Freq: Every day | ORAL | 0 refills | Status: AC
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
## 2018-03-27 NOTE — Discharge Instructions (Signed)
Was nice talking to you today.  Congratulations on your marriage.  I wish you and your new wife the best.  Take medications as prescribed.  Watch your sugars very closely as diabetes is the most common risk factor for balanitis.  Follow-up with your primary care provider as needed.

## 2018-03-27 NOTE — ED Triage Notes (Signed)
Pt here for a follow up after going to the ER Sunday. Pt state she passed out on Sunday. Pt states he also has a yeast around his penis. Pt is a diabetic.

## 2018-05-04 ENCOUNTER — Other Ambulatory Visit: Payer: Self-pay | Admitting: Endocrinology

## 2018-05-04 NOTE — Telephone Encounter (Signed)
Please refill x 1 Ov is due  

## 2018-06-03 ENCOUNTER — Ambulatory Visit (HOSPITAL_COMMUNITY)
Admission: EM | Admit: 2018-06-03 | Discharge: 2018-06-03 | Disposition: A | Discharge status: Home / Self Care | Payer: 59 | Attending: Family Medicine | Admitting: Family Medicine

## 2018-06-03 ENCOUNTER — Encounter (HOSPITAL_COMMUNITY): Payer: Self-pay | Admitting: Emergency Medicine

## 2018-06-03 DIAGNOSIS — F330 Major depressive disorder, recurrent, mild: Secondary | ICD-10-CM

## 2018-06-03 DIAGNOSIS — Z760 Encounter for issue of repeat prescription: Secondary | ICD-10-CM | POA: Diagnosis not present

## 2018-06-03 DIAGNOSIS — I10 Essential (primary) hypertension: Secondary | ICD-10-CM | POA: Diagnosis not present

## 2018-06-03 DIAGNOSIS — E1165 Type 2 diabetes mellitus with hyperglycemia: Secondary | ICD-10-CM | POA: Diagnosis not present

## 2018-06-03 DIAGNOSIS — F329 Major depressive disorder, single episode, unspecified: Secondary | ICD-10-CM

## 2018-06-03 DIAGNOSIS — F32A Depression, unspecified: Secondary | ICD-10-CM

## 2018-06-03 DIAGNOSIS — E119 Type 2 diabetes mellitus without complications: Secondary | ICD-10-CM

## 2018-06-03 MED ORDER — FLUCONAZOLE 150 MG PO TABS: ORAL_TABLET | ORAL | 0 refills | Status: DC

## 2018-06-03 MED ORDER — SERTRALINE HCL 50 MG PO TABS: 50.0000 mg | ORAL_TABLET | Freq: Every day | ORAL | 1 refills | Status: DC

## 2018-06-03 MED ORDER — DAPAGLIFLOZIN PROPANEDIOL 10 MG PO TABS: 10.0000 mg | ORAL_TABLET | Freq: Every day | ORAL | 1 refills | Status: DC

## 2018-06-03 MED ORDER — LISINOPRIL-HYDROCHLOROTHIAZIDE 20-12.5 MG PO TABS
ORAL_TABLET | ORAL | 1 refills | Status: DC
Start: 2018-06-03 — End: 2018-11-16

## 2018-06-03 MED ORDER — METFORMIN HCL 1000 MG PO TABS: 1000.0000 mg | ORAL_TABLET | Freq: Two times a day (BID) | ORAL | 1 refills | Status: DC

## 2018-06-03 NOTE — ED Triage Notes (Signed)
Pt states he is here for his refills on his medications that "Dr. Chestine Sporelark" fills for him.  Zoloft, lisinopril, metformin.

## 2018-06-04 NOTE — ED Provider Notes (Signed)
[facility]   [id number] 06/03/18 Arrival Time: 1621  ASSESSMENT & PLAN:  1. Medication refill   2. Type 2 diabetes mellitus without complication, without long-term current use of insulin (HCC)   3. Essential hypertension   4. Mild episode of recurrent major depressive disorder (HCC)   5. Type 2 diabetes mellitus with hyperglycemia, without long-term current use of insulin (HCC)   6. Depression, unspecified depression type     Meds ordered this encounter  Medications  . lisinopril-hydrochlorothiazide (PRINZIDE,ZESTORETIC) 20-12.5 MG tablet    Sig: Take one tab daily    Dispense:  90 tablet    Refill:  1  . metFORMIN (GLUCOPHAGE) 1000 MG tablet    Sig: Take 1 tablet (1,000 mg total) by mouth 2 (two) times daily with a meal.    Dispense:  180 tablet    Refill:  1  . dapagliflozin propanediol (FARXIGA) 10 MG TABS tablet    Sig: Take 10 mg by mouth daily.    Dispense:  90 tablet    Refill:  1    Future refills will require an appt  . sertraline (ZOLOFT) 50 MG tablet    Sig: Take 1 tablet (50 mg total) by mouth daily.    Dispense:  90 tablet    Refill:  1  . fluconazole (DIFLUCAN) 150 MG tablet    Sig: Take one by mouth as a single dose if needed.    Dispense:  1 tablet    Refill:  0   Recommend that he call and schedule a follow up appointment with PCP.  Reviewed expectations re: course of current medical issues. Questions answered. Outlined signs and symptoms indicating need for more acute intervention. Patient verbalized understanding. After Visit Summary given.   SUBJECTIVE: History from: patient. Zachary Walsh is a 58 y.o. male who presents requesting medication refill. No current concerns. Reports blood sugars "running a little high". No symptoms. BP controlled. Depression controlled.  Current medical problems include: Past Medical History:  Diagnosis Date  . Diabetes mellitus   . Hypertension   . Seasonal allergies     No current  facility-administered medications for this encounter.   Current Outpatient Medications:  .  aspirin 81 MG chewable tablet, Chew 81 mg by mouth daily., Disp: , Rfl:  .  busPIRone (BUSPAR) 10 MG tablet, Take 0.5-1 tablets (5-10 mg total) by mouth 3 (three) times daily., Disp: 60 tablet, Rfl: 0 .  Continuous Blood Gluc Receiver (FREESTYLE LIBRE 14 DAY READER) DEVI, 1 Device by Does not apply route 3 (three) times daily. E11.65 Use for continuous blood glucose monitoring, Disp: 1 Device, Rfl: 0 .  Continuous Blood Gluc Sensor (FREESTYLE LIBRE 14 DAY SENSOR) MISC, 1 Device by Does not apply route 3 (three) times daily. E11.65, Disp: 2 each, Rfl: 6 .  dapagliflozin propanediol (FARXIGA) 10 MG TABS tablet, Take 10 mg by mouth daily., Disp: 90 tablet, Rfl: 1 .  Dulaglutide (TRULICITY) 1.5 MG/0.5ML SOPN, Inject 1.5 mg into the skin once a week., Disp: 30 pen, Rfl: 0 .  fluconazole (DIFLUCAN) 150 MG tablet, Take one by mouth as a single dose if needed., Disp: 1 tablet, Rfl: 0 .  glucagon 1 MG injection, Inject 1 mg into the vein once as needed for up to 1 dose., Disp: 1 each, Rfl: 12 .  lisinopril-hydrochlorothiazide (PRINZIDE,ZESTORETIC) 20-12.5 MG tablet, Take one tab daily, Disp: 90 tablet, Rfl: 1 .  metFORMIN (GLUCOPHAGE) 1000 MG tablet, Take 1 tablet (1,000 mg total) by mouth  2 (two) times daily with a meal., Disp: 180 tablet, Rfl: 1 .  nystatin cream (MYCOSTATIN), Apply to affected area 2 times daily, Disp: 30 g, Rfl: 0 .  sertraline (ZOLOFT) 50 MG tablet, Take 1 tablet (50 mg total) by mouth daily., Disp: 90 tablet, Rfl: 1 .  trimethoprim-polymyxin b (POLYTRIM) ophthalmic solution, Place 1 drop into the right eye every 4 (four) hours., Disp: 10 mL, Rfl: 0  ROS: As per HPI.   OBJECTIVE:  Vitals:   06/03/18 1705  BP: 129/75  Pulse: 73  Resp: 18  Temp: 98.2 F (36.8 C)  TempSrc: Oral  SpO2: 97%    General appearance: alert; no distress Eyes: PERRLA; EOMI; conjunctiva normal HENT:  normocephalic; atraumatic Neck: supple  Lungs: clear to auscultation bilaterally Heart: regular rate and rhythm Abdomen: soft, non-tender; bowel sounds normal; no masses or organomegaly; no guarding or rebound tenderness Extremities: no edema; symmetrical with no gross deformities Skin: warm and dry Psychological: alert and cooperative; normal mood and affect  Labs Reviewed: Results for orders placed or performed during the hospital encounter of 03/23/18  Basic metabolic panel  Result Value Ref Range   Sodium 131 (L) 135 - 145 mmol/L   Potassium 3.9 3.5 - 5.1 mmol/L   Chloride 97 (L) 98 - 111 mmol/L   CO2 26 22 - 32 mmol/L   Glucose, Bld 341 (H) 70 - 99 mg/dL   BUN 17 6 - 20 mg/dL   Creatinine, Ser 4.26 0.61 - 1.24 mg/dL   Calcium 9.9 8.9 - 83.4 mg/dL   GFR calc non Af Amer >60 >60 mL/min   GFR calc Af Amer >60 >60 mL/min   Anion gap 8 5 - 15  CBC WITH DIFFERENTIAL  Result Value Ref Range   WBC 7.3 4.0 - 10.5 K/uL   RBC 4.64 4.22 - 5.81 MIL/uL   Hemoglobin 14.0 13.0 - 17.0 g/dL   HCT 19.6 22.2 - 97.9 %   MCV 92.2 80.0 - 100.0 fL   MCH 30.2 26.0 - 34.0 pg   MCHC 32.7 30.0 - 36.0 g/dL   RDW 89.2 11.9 - 41.7 %   Platelets 205 150 - 400 K/uL   nRBC 0.0 0.0 - 0.2 %   Neutrophils Relative % 63 %   Neutro Abs 4.6 1.7 - 7.7 K/uL   Lymphocytes Relative 27 %   Lymphs Abs 2.0 0.7 - 4.0 K/uL   Monocytes Relative 8 %   Monocytes Absolute 0.6 0.1 - 1.0 K/uL   Eosinophils Relative 2 %   Eosinophils Absolute 0.1 0.0 - 0.5 K/uL   Basophils Relative 0 %   Basophils Absolute 0.0 0.0 - 0.1 K/uL   Immature Granulocytes 0 %   Abs Immature Granulocytes 0.03 0.00 - 0.07 K/uL  CBG monitoring, ED  Result Value Ref Range   Glucose-Capillary 250 (H) 70 - 99 mg/dL  I-stat troponin, ED  Result Value Ref Range   Troponin i, poc 0.01 0.00 - 0.08 ng/mL   Comment 3           Labs Reviewed - No data to display  Allergies  Allergen Reactions  . Benadryl [Diphenhydramine Hcl] Anxiety     Social History   Socioeconomic History  . Marital status: Married    Spouse name: Not on file  . Number of children: Not on file  . Years of education: Not on file  . Highest education level: Not on file  Occupational History  . Not on file  Social  Needs  . Financial resource strain: Not on file  . Food insecurity:    Worry: Not on file    Inability: Not on file  . Transportation needs:    Medical: Not on file    Non-medical: Not on file  Tobacco Use  . Smoking status: Never Smoker  . Smokeless tobacco: Never Used  Substance and Sexual Activity  . Alcohol use: No  . Drug use: No  . Sexual activity: Yes  Lifestyle  . Physical activity:    Days per week: Not on file    Minutes per session: Not on file  . Stress: Not on file  Relationships  . Social connections:    Talks on phone: Not on file    Gets together: Not on file    Attends religious service: Not on file    Active member of club or organization: Not on file    Attends meetings of clubs or organizations: Not on file    Relationship status: Not on file  . Intimate partner violence:    Fear of current or ex partner: Not on file    Emotionally abused: Not on file    Physically abused: Not on file    Forced sexual activity: Not on file  Other Topics Concern  . Not on file  Social History Narrative  . Not on file   Family History  Problem Relation Age of Onset  . Heart disease Mother   . Heart failure Mother   . Heart disease Father   . Diabetes Father   . Heart failure Father    Past Surgical History:  Procedure Laterality Date  . HERNIA REPAIR    . right leg fracture       Mardella LaymanHagler, Laconda Basich, MD 06/04/18 1003

## 2018-06-23 ENCOUNTER — Ambulatory Visit (HOSPITAL_COMMUNITY)
Admission: EM | Admit: 2018-06-23 | Discharge: 2018-06-23 | Disposition: A | Discharge status: Home / Self Care | Payer: 59 | Attending: Family Medicine | Admitting: Family Medicine

## 2018-06-23 ENCOUNTER — Encounter (HOSPITAL_COMMUNITY): Payer: Self-pay | Admitting: Emergency Medicine

## 2018-06-23 DIAGNOSIS — R69 Illness, unspecified: Secondary | ICD-10-CM

## 2018-06-23 DIAGNOSIS — J111 Influenza due to unidentified influenza virus with other respiratory manifestations: Secondary | ICD-10-CM

## 2018-06-23 LAB — POCT RAPID STREP A: Streptococcus, Group A Screen (Direct): NEGATIVE

## 2018-06-23 MED ORDER — BENZONATATE 100 MG PO CAPS: 100.0000 mg | ORAL_CAPSULE | Freq: Three times a day (TID) | ORAL | 0 refills | Status: DC

## 2018-06-23 MED ORDER — FLUCONAZOLE 150 MG PO TABS: 150.0000 mg | ORAL_TABLET | Freq: Once | ORAL | 0 refills | Status: AC

## 2018-06-23 MED ORDER — CLOTRIMAZOLE 1 % EX CREA: TOPICAL_CREAM | CUTANEOUS | 0 refills | Status: DC

## 2018-06-23 NOTE — ED Triage Notes (Signed)
Pt here for URI sx with fever this am
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
## 2018-06-23 NOTE — Discharge Instructions (Addendum)
You likely having a viral upper respiratory infection. We recommended symptom control. I expect your symptoms to start improving in the next 1-2 weeks.   1. Take a daily allergy pill/anti-histamine like Zyrtec, Claritin, or Store brand consistently for 2 weeks  2. For congestion you may try an oral decongestant like Mucinex. You may also try intranasal flonase nasal spray or saline irrigations (neti pot, sinus cleanse)  3. For your sore throat you may try cepacol lozenges, salt water gargles, throat spray. Treatment of congestion may also help your sore throat.  4. For cough you may try Tessalon, Delsym, Robitussen, Mucinex DM  5. Take Tylenol or Ibuprofen to help with pain/inflammation  6. Stay hydrated, drink plenty of fluids to keep throat coated and less irritated  Honey Tea For cough/sore throat try using a honey-based tea. Use 3 teaspoons of honey with juice squeezed from half lemon. Place shaved pieces of ginger into 1/2-1 cup of water and warm over stove top. Then mix the ingredients and repeat every 4 hours as needed.

## 2018-06-26 LAB — CULTURE, GROUP A STREP (THRC)

## 2018-06-27 ENCOUNTER — Telehealth (HOSPITAL_COMMUNITY): Payer: Self-pay | Admitting: Emergency Medicine

## 2018-06-27 NOTE — Telephone Encounter (Signed)
Culture is positive for non group A Strep germ.  This is a finding of uncertain significance; not the typical 'strep throat' germ.  Pt verbalized understanding and is feeling much improved

## 2018-11-16 ENCOUNTER — Ambulatory Visit (HOSPITAL_COMMUNITY)
Admission: EM | Admit: 2018-11-16 | Discharge: 2018-11-16 | Disposition: A | Discharge status: Home / Self Care | Payer: 59 | Attending: Family Medicine | Admitting: Family Medicine

## 2018-11-16 ENCOUNTER — Other Ambulatory Visit: Payer: Self-pay

## 2018-11-16 DIAGNOSIS — I10 Essential (primary) hypertension: Secondary | ICD-10-CM

## 2018-11-16 DIAGNOSIS — F330 Major depressive disorder, recurrent, mild: Secondary | ICD-10-CM

## 2018-11-16 DIAGNOSIS — E119 Type 2 diabetes mellitus without complications: Secondary | ICD-10-CM | POA: Diagnosis not present

## 2018-11-16 DIAGNOSIS — Z760 Encounter for issue of repeat prescription: Secondary | ICD-10-CM | POA: Diagnosis not present

## 2018-11-16 MED ORDER — FARXIGA 10 MG PO TABS: 10.0000 mg | ORAL_TABLET | Freq: Every day | ORAL | 1 refills | Status: AC

## 2018-11-16 MED ORDER — SERTRALINE HCL 50 MG PO TABS: 50.0000 mg | ORAL_TABLET | Freq: Every day | ORAL | 1 refills | Status: AC

## 2018-11-16 MED ORDER — LISINOPRIL-HYDROCHLOROTHIAZIDE 20-12.5 MG PO TABS: ORAL_TABLET | ORAL | 1 refills | Status: AC

## 2018-11-16 MED ORDER — METFORMIN HCL 1000 MG PO TABS: 1000.0000 mg | ORAL_TABLET | Freq: Two times a day (BID) | ORAL | 0 refills | Status: DC
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
## 2018-11-16 NOTE — ED Triage Notes (Signed)
Pt Is here for medication refill. No medical issues

## 2018-12-08 ENCOUNTER — Other Ambulatory Visit: Payer: Self-pay | Admitting: Family Medicine

## 2018-12-08 DIAGNOSIS — F330 Major depressive disorder, recurrent, mild: Secondary | ICD-10-CM

## 2018-12-26 DIAGNOSIS — F32A Depression, unspecified: Secondary | ICD-10-CM | POA: Insufficient documentation

## 2019-01-05 ENCOUNTER — Other Ambulatory Visit: Payer: Self-pay | Admitting: Family Medicine

## 2019-01-05 DIAGNOSIS — F330 Major depressive disorder, recurrent, mild: Secondary | ICD-10-CM

## 2019-01-05 NOTE — Telephone Encounter (Signed)
Requested medication (s) are due for refill today: yes  Requested medication (s) are on the active medication list: yes  Last refill:  12/10/2018  Future visit scheduled: no  Notes to clinic:  Review for refill  Requested Prescriptions  Pending Prescriptions Disp Refills   sertraline (ZOLOFT) 50 MG tablet [Pharmacy Med Name: SERTRALINE HCL 50 MG TABLET] 30 tablet 1    Sig: TAKE 1 TABLET BY MOUTH EVERY DAY     Psychiatry:  Antidepressants - SSRI Failed - 01/05/2019  1:45 AM      Failed - Valid encounter within last 6 months    Recent Outpatient Visits          1 year ago Mild episode of recurrent major depressive disorder ([place])   [facility] [place], PA-C   1 year ago Type 2 diabetes mellitus with complication, unspecified whether long term insulin use ([place])   Primary Care at [facility], Arlie Solomons, MD   1 year ago Dysthymia   [facility] at [place], PA-C   1 year ago Uncontrolled type 2 diabetes mellitus with hyperglycemia [facility])   Primary Care at [facility], Zoe A, MD   1 year ago Uncontrolled type 2 diabetes mellitus with hyperglycemia [facility])   [facility] at [place], MD             Failed - Completed PHQ-2 or PHQ-9 in the last 360 days.

## 2019-02-12 ENCOUNTER — Ambulatory Visit (HOSPITAL_COMMUNITY)
Admission: EM | Admit: 2019-02-12 | Discharge: 2019-02-12 | Disposition: A | Discharge status: Home / Self Care | Payer: 59 | Attending: Family Medicine | Admitting: Family Medicine

## 2019-02-12 ENCOUNTER — Other Ambulatory Visit: Payer: Self-pay

## 2019-02-12 ENCOUNTER — Encounter (HOSPITAL_COMMUNITY): Payer: Self-pay

## 2019-02-12 DIAGNOSIS — M791 Myalgia, unspecified site: Secondary | ICD-10-CM

## 2019-02-12 NOTE — ED Triage Notes (Signed)
Pt states he was working out this morning 4:30 am to 5:30 am the pain started in his left arm around 9:15 am. Pt states he was lifting the iron ball in the gym.

## 2019-02-12 NOTE — Discharge Instructions (Signed)
I believe this is muscle pain If the pain returns or worsens with chest pain, SOB you need to go to the ER  Your EKG was normal
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
## 2019-03-30 ENCOUNTER — Other Ambulatory Visit: Payer: Self-pay

## 2019-03-30 DIAGNOSIS — Z20822 Contact with and (suspected) exposure to covid-19: Secondary | ICD-10-CM

## 2019-03-31 LAB — NOVEL CORONAVIRUS, NAA: SARS-CoV-2, NAA: NOT DETECTED

## 2019-04-01 ENCOUNTER — Telehealth: Payer: Self-pay | Admitting: *Deleted

## 2019-04-01 NOTE — Telephone Encounter (Signed)
Patient called ,given negative covid results . 

## 2019-04-06 DIAGNOSIS — I10 Essential (primary) hypertension: Secondary | ICD-10-CM | POA: Insufficient documentation

## 2019-04-22 ENCOUNTER — Other Ambulatory Visit: Payer: Self-pay

## 2019-04-22 ENCOUNTER — Ambulatory Visit (HOSPITAL_COMMUNITY)
Admission: EM | Admit: 2019-04-22 | Discharge: 2019-04-22 | Disposition: A | Discharge status: Home / Self Care | Payer: 59 | Attending: Family Medicine | Admitting: Family Medicine

## 2019-04-22 ENCOUNTER — Ambulatory Visit (INDEPENDENT_AMBULATORY_CARE_PROVIDER_SITE_OTHER): Payer: 59

## 2019-04-22 DIAGNOSIS — S20212A Contusion of left front wall of thorax, initial encounter: Secondary | ICD-10-CM | POA: Diagnosis not present

## 2019-04-22 DIAGNOSIS — R0782 Intercostal pain: Secondary | ICD-10-CM | POA: Diagnosis not present

## 2019-04-22 MED ORDER — HYDROCODONE-ACETAMINOPHEN 5-325 MG PO TABS: 1.0000 | ORAL_TABLET | Freq: Four times a day (QID) | ORAL | 0 refills | Status: DC | PRN

## 2019-04-22 MED ORDER — NAPROXEN 500 MG PO TABS: 500.0000 mg | ORAL_TABLET | Freq: Two times a day (BID) | ORAL | 0 refills | Status: DC

## 2019-04-22 NOTE — ED Triage Notes (Signed)
Patient presents to Urgent Care with complaints of rib and back pain s/p fall PTA 2/2 slipping. Pt states he landed on his side/back and pain occurred immediately; pain inc with movement and breathing. Denies LOC or n/v.

## 2019-04-22 NOTE — Discharge Instructions (Signed)
Be aware, pain medications may cause drowsiness. Please do not drive, operate heavy machinery or make important decisions while on this medication, it can cloud your judgement.  

## 2019-04-25 NOTE — ED Provider Notes (Signed)
[place]   [id number] 04/22/19 Arrival Time: 5427  ASSESSMENT & PLAN:  1. Rib contusion, left, initial encounter     I have personally viewed the imaging studies ordered this visit. No rib fractures or pneumothorax appreciated.   Begin: Meds ordered this encounter  Medications  . naproxen (NAPROSYN) 500 MG tablet    Sig: Take 1 tablet (500 mg total) by mouth 2 (two) times daily with a meal.    Dispense:  20 tablet    Refill:  0  . HYDROcodone-acetaminophen (NORCO/VICODIN) 5-325 MG tablet    Sig: Take 1 tablet by mouth every 6 (six) hours as needed for moderate pain or severe pain.    Dispense:  15 tablet    Refill:  0    Encouraged deep breaths every once in awhile.   Discharge Instructions     Be aware, pain medications may cause drowsiness. Please do not drive, operate heavy machinery or make important decisions while on this medication, it can cloud your judgement.    Follow-up Information    Scot Jun, FNP.   Specialty: Family Medicine Why: As needed. Contact information: [place] [phone]        Mobile.   Specialty: Urgent Care Why: If worsening or failing to improve as anticipated. Contact information: [place] [phone]           Reviewed expectations re: course of current medical issues. Questions answered. Outlined signs and symptoms indicating need for more acute intervention. Patient verbalized understanding. After Visit Summary given.   SUBJECTIVE: History from: patient. Zachary Walsh is a 59 y.o. male who presents with complaint of persistent L sided rib/chest wall pain. Onset abrupt, today; reports falling onto his L side; immediate pain. Discomfort described as aching and dull; without radiation. Symptoms are unchanged since beginning. Aggravating factors: include certain movements  and deep breaths. Alleviating factors: "sitting still". Associated symptoms: none reported. He denies nausea, sweats and vomiting. No specific SOB reported. Appetite: normal. PO intake: normal. Ambulatory without assistance. History of similar: no. OTC treatment: none reported.   Past Surgical History:  Procedure Laterality Date  . HERNIA REPAIR    . right leg fracture      ROS: As per HPI. All other systems negative.  OBJECTIVE:  Vitals:   04/22/19 1832  BP: 128/80  Pulse: 76  Resp: 16  Temp: 98.5 F (36.9 C)  TempSrc: Oral  SpO2: 99%    General appearance: alert, oriented; appears to be in pain while sitting in chair HEENT: [place]; AT; oropharynx moist Lungs: clear to auscultation bilaterally; unlabored respirations Heart: regular rate and rhythm Ribs: very tender over L ribcage/side; no gross abnormalities/bruising Abdomen: soft; without distention; no specific tenderness to palpation; normal bowel sounds; without masses or organomegaly; without guarding or rebound tenderness Back: without CVA tenderness; FROM at waist Extremities: without LE edema; symmetrical; without gross deformities Skin: warm and dry Neurologic: normal gait Psychological: alert and cooperative; normal mood and affect   Imaging: DG Ribs Unilateral W/Chest Left  Result Date: 04/22/2019 CLINICAL DATA:  Left-sided rib pain status post fall. EXAM: LEFT RIBS AND CHEST - 3+ VIEW COMPARISON:  Chest x-ray dated March 23, 2018. FINDINGS: No fracture or other bone lesions are seen involving the ribs. There is no evidence of pneumothorax or pleural effusion. Both lungs are clear. Heart size and mediastinal contours are within normal limits.  IMPRESSION: Negative. Electronically Signed   By: Katherine Mantle M.D.   On: 04/22/2019 19:24     Allergies  Allergen Reactions  . Benadryl [Diphenhydramine Hcl] Anxiety                                               Past Medical History:  Diagnosis Date  .  Diabetes mellitus   . Hypertension   . Seasonal allergies    Social History   Socioeconomic History  . Marital status: Married    Spouse name: Not on file  . Number of children: Not on file  . Years of education: Not on file  . Highest education level: Not on file  Occupational History  . Not on file  Tobacco Use  . Smoking status: Never Smoker  . Smokeless tobacco: Never Used  Substance and Sexual Activity  . Alcohol use: No  . Drug use: No  . Sexual activity: Yes  Other Topics Concern  . Not on file  Social History Narrative  . Not on file   Social Determinants of Health   Financial Resource Strain:   . Difficulty of Paying Living Expenses: Not on file  Food Insecurity:   . Worried About Programme researcher, broadcasting/film/video in the Last Year: Not on file  . Ran Out of Food in the Last Year: Not on file  Transportation Needs:   . Lack of Transportation (Medical): Not on file  . Lack of Transportation (Non-Medical): Not on file  Physical Activity:   . Days of Exercise per Week: Not on file  . Minutes of Exercise per Session: Not on file  Stress:   . Feeling of Stress : Not on file  Social Connections:   . Frequency of Communication with Friends and Family: Not on file  . Frequency of Social Gatherings with Friends and Family: Not on file  . Attends Religious Services: Not on file  . Active Member of Clubs or Organizations: Not on file  . Attends Banker Meetings: Not on file  . Marital Status: Not on file  Intimate Partner Violence:   . Fear of Current or Ex-Partner: Not on file  . Emotionally Abused: Not on file  . Physically Abused: Not on file  . Sexually Abused: Not on file   Family History  Problem Relation Age of Onset  . Heart disease Mother   . Heart failure Mother   . Heart disease Father   . Diabetes Father   . Heart failure Father      Mardella Layman, MD 04/25/19 1037

## 2019-04-27 ENCOUNTER — Other Ambulatory Visit: Payer: Self-pay

## 2019-04-27 ENCOUNTER — Encounter (HOSPITAL_COMMUNITY): Payer: Self-pay

## 2019-04-27 ENCOUNTER — Ambulatory Visit (HOSPITAL_COMMUNITY)
Admission: EM | Admit: 2019-04-27 | Discharge: 2019-04-27 | Disposition: A | Discharge status: Home / Self Care | Payer: 59 | Attending: Family Medicine | Admitting: Family Medicine

## 2019-04-27 ENCOUNTER — Ambulatory Visit (INDEPENDENT_AMBULATORY_CARE_PROVIDER_SITE_OTHER): Payer: 59

## 2019-04-27 DIAGNOSIS — W208XXA Other cause of strike by thrown, projected or falling object, initial encounter: Secondary | ICD-10-CM | POA: Diagnosis not present

## 2019-04-27 DIAGNOSIS — S90121A Contusion of right lesser toe(s) without damage to nail, initial encounter: Secondary | ICD-10-CM
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
## 2019-04-27 NOTE — ED Triage Notes (Signed)
Pt presents with toe injury from yesterday after trying to build a ramp and wood slabs fell on his foot.

## 2019-04-27 NOTE — Discharge Instructions (Signed)
Use anti-inflammatories for pain/swelling. You may take up to 800 mg Ibuprofen every 8 hours with food. You may supplement Ibuprofen with Tylenol 500-1000 mg every 8 hours.  Ice and elevate Follow up if not improving or worsening 

## 2019-08-02 ENCOUNTER — Encounter (HOSPITAL_COMMUNITY): Payer: Self-pay

## 2019-08-02 ENCOUNTER — Ambulatory Visit (HOSPITAL_COMMUNITY)
Admission: EM | Admit: 2019-08-02 | Discharge: 2019-08-02 | Disposition: A | Discharge status: Home / Self Care | Payer: 59 | Attending: Family Medicine | Admitting: Family Medicine

## 2019-08-02 ENCOUNTER — Other Ambulatory Visit: Payer: Self-pay

## 2019-08-02 DIAGNOSIS — M25511 Pain in right shoulder: Secondary | ICD-10-CM

## 2019-08-02 DIAGNOSIS — L853 Xerosis cutis: Secondary | ICD-10-CM

## 2019-08-02 MED ORDER — NAPROXEN 500 MG PO TABS: 500.0000 mg | ORAL_TABLET | Freq: Two times a day (BID) | ORAL | 0 refills | Status: DC

## 2019-08-02 MED ORDER — AMMONIUM LACTATE 12 % EX CREA: TOPICAL_CREAM | Freq: Two times a day (BID) | CUTANEOUS | 1 refills | Status: AC

## 2019-08-02 NOTE — ED Triage Notes (Signed)
Pt reports having left shoulder pain x over 8 weeks.Pt states he tolerates do dumbbell training x 2 at week.  Pt denies any numbness or tingling. Pt reports his feet have dry skin x several weeks.
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
## 2019-08-02 NOTE — Discharge Instructions (Signed)
Take the naproxen 2 x a day with food for shoulder pain Use the medicated lotion 2 x a day for dry feet Wear socks after coating feet at night See your PCP in follow up

## 2019-11-02 ENCOUNTER — Encounter (HOSPITAL_COMMUNITY): Payer: Self-pay

## 2019-11-02 ENCOUNTER — Other Ambulatory Visit: Payer: Self-pay

## 2019-11-02 ENCOUNTER — Ambulatory Visit (HOSPITAL_COMMUNITY)
Admission: EM | Admit: 2019-11-02 | Discharge: 2019-11-02 | Disposition: A | Discharge status: Home / Self Care | Payer: 59 | Attending: Physician Assistant | Admitting: Physician Assistant

## 2019-11-02 DIAGNOSIS — M25512 Pain in left shoulder: Secondary | ICD-10-CM

## 2019-11-02 DIAGNOSIS — M5431 Sciatica, right side: Secondary | ICD-10-CM | POA: Diagnosis not present

## 2019-11-02 MED ORDER — METHOCARBAMOL 500 MG PO TABS: 500.0000 mg | ORAL_TABLET | Freq: Four times a day (QID) | ORAL | 0 refills | Status: AC

## 2019-11-02 MED ORDER — DICLOFENAC SODIUM 75 MG PO TBEC: 75.0000 mg | DELAYED_RELEASE_TABLET | Freq: Two times a day (BID) | ORAL | 0 refills | Status: DC

## 2019-11-02 NOTE — ED Triage Notes (Signed)
Pt c/o 8/10 throbbing pain in right hip that radiates down to back of right thighx1 wk. Pt c/o 7/10 throbbing pain in left shoulderx3 mos. Pt states left fingers have intermittent numbness and tingling. Pt able to move all extremities. Pt walked well to exam room. Pt denies injury.
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
## 2020-05-27 ENCOUNTER — Telehealth: Payer: Self-pay | Admitting: *Deleted

## 2020-05-27 NOTE — Telephone Encounter (Signed)
Called pt for his 9 am PV-  Pt states he has been dealing with Covid issues since 02-2020- he still isnt well, and he wishes to wait several months to do a colon due to sedation and his breathing issues and weakness- Placed recall for 08-2020 [phone]

## 2020-06-10 ENCOUNTER — Encounter: Payer: 59 | Admitting: Internal Medicine

## 2020-07-28 ENCOUNTER — Telehealth (HOSPITAL_COMMUNITY): Payer: Self-pay

## 2020-07-28 ENCOUNTER — Other Ambulatory Visit: Payer: Self-pay

## 2020-07-28 ENCOUNTER — Encounter (HOSPITAL_COMMUNITY): Payer: Self-pay | Admitting: Emergency Medicine

## 2020-07-28 ENCOUNTER — Ambulatory Visit (HOSPITAL_COMMUNITY)
Admission: EM | Admit: 2020-07-28 | Discharge: 2020-07-28 | Disposition: A | Discharge status: Home / Self Care | Payer: 59 | Attending: Internal Medicine | Admitting: Internal Medicine

## 2020-07-28 DIAGNOSIS — L299 Pruritus, unspecified: Secondary | ICD-10-CM | POA: Diagnosis not present

## 2020-07-28 MED ORDER — ACETIC ACID 2 % OT SOLN: 4.0000 [drp] | Freq: Four times a day (QID) | OTIC | 0 refills | Status: AC

## 2020-07-28 MED ORDER — ACETIC ACID 2 % OT SOLN: 4.0000 [drp] | Freq: Four times a day (QID) | OTIC | 0 refills | Status: DC
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
## 2020-07-28 NOTE — ED Triage Notes (Signed)
Pt presents with tingling/ itching in right ear xs 2 weeks.

## 2020-07-28 NOTE — Discharge Instructions (Signed)
Please use medications as directed Avoid using Q-tips in your ears on a daily basis If you experience worsening pain, difficulty hearing or vertigo please return to the urgent care to be reevaluated.

## 2020-12-16 ENCOUNTER — Ambulatory Visit (HOSPITAL_COMMUNITY)
Admission: EM | Admit: 2020-12-16 | Discharge: 2020-12-16 | Disposition: A | Discharge status: Home / Self Care | Payer: 59 | Attending: Physician Assistant | Admitting: Physician Assistant

## 2020-12-16 ENCOUNTER — Encounter (HOSPITAL_COMMUNITY): Payer: Self-pay | Admitting: Emergency Medicine

## 2020-12-16 DIAGNOSIS — I10 Essential (primary) hypertension: Secondary | ICD-10-CM

## 2020-12-16 DIAGNOSIS — Z760 Encounter for issue of repeat prescription: Secondary | ICD-10-CM

## 2020-12-16 DIAGNOSIS — E1165 Type 2 diabetes mellitus with hyperglycemia: Secondary | ICD-10-CM

## 2020-12-16 DIAGNOSIS — E119 Type 2 diabetes mellitus without complications: Secondary | ICD-10-CM

## 2020-12-16 DIAGNOSIS — Z7984 Long term (current) use of oral hypoglycemic drugs: Secondary | ICD-10-CM | POA: Diagnosis not present

## 2020-12-16 DIAGNOSIS — E1159 Type 2 diabetes mellitus with other circulatory complications: Secondary | ICD-10-CM | POA: Diagnosis not present

## 2020-12-16 LAB — POCT URINALYSIS DIPSTICK, ED / UC
Bilirubin Urine: NEGATIVE
Glucose, UA: >=1000 mg/dL — AB
Hgb urine dipstick: NEGATIVE
Ketones, ur: NEGATIVE mg/dL
Leukocytes,Ua: NEGATIVE
Nitrite: NEGATIVE
Protein, ur: NEGATIVE mg/dL
Specific Gravity, Urine: 1.01 (ref 1.005–1.030)
Urobilinogen, UA: 0.2 mg/dL (ref 0.0–1.0)
pH: 5.5 (ref 5.0–8.0)

## 2020-12-16 LAB — CBG MONITORING, ED: Glucose-Capillary: 380 mg/dL — ABNORMAL HIGH (ref 70–99)

## 2020-12-16 MED ORDER — METFORMIN HCL 1000 MG PO TABS: 1000.0000 mg | ORAL_TABLET | Freq: Two times a day (BID) | ORAL | 0 refills | Status: AC
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
## 2020-12-16 NOTE — Discharge Instructions (Addendum)
Your urine is normal other than glucose in your urine which we expect.  Please take your metformin as previously prescribed as well as all of your other medication.  Monitor your fasting blood sugar regularly and if this remains elevated you need to be seen to consider additional medication.  Follow-up with your primary care provider as scheduled next week.  I recommend that you drink lots of fluid and avoid carbohydrates for the next 24 hours.  If you develop any worsening symptoms including shortness of breath, nausea, vomiting, dizziness you need to go to the emergency room.

## 2020-12-16 NOTE — ED Triage Notes (Signed)
PCP became out of network, looking for new PCP, needs refill of metformin until new PCP visit.

## 2021-01-03 ENCOUNTER — Encounter: Payer: Self-pay | Admitting: Internal Medicine

## 2021-08-30 ENCOUNTER — Ambulatory Visit: Payer: 59 | Admitting: Podiatry

## 2021-08-30 ENCOUNTER — Ambulatory Visit (INDEPENDENT_AMBULATORY_CARE_PROVIDER_SITE_OTHER): Payer: 59

## 2021-08-30 ENCOUNTER — Encounter: Payer: Self-pay | Admitting: Podiatry

## 2021-08-30 DIAGNOSIS — M7671 Peroneal tendinitis, right leg: Secondary | ICD-10-CM | POA: Diagnosis not present

## 2021-08-30 DIAGNOSIS — M7750 Other enthesopathy of unspecified foot: Secondary | ICD-10-CM

## 2021-08-30 DIAGNOSIS — E119 Type 2 diabetes mellitus without complications: Secondary | ICD-10-CM | POA: Insufficient documentation

## 2021-08-30 DIAGNOSIS — M7751 Other enthesopathy of right foot: Secondary | ICD-10-CM

## 2021-08-30 DIAGNOSIS — E785 Hyperlipidemia, unspecified: Secondary | ICD-10-CM | POA: Insufficient documentation

## 2021-08-30 DIAGNOSIS — E559 Vitamin D deficiency, unspecified: Secondary | ICD-10-CM | POA: Insufficient documentation

## 2021-08-30 DIAGNOSIS — M25519 Pain in unspecified shoulder: Secondary | ICD-10-CM | POA: Insufficient documentation

## 2021-08-30 DIAGNOSIS — L03039 Cellulitis of unspecified toe: Secondary | ICD-10-CM | POA: Insufficient documentation

## 2021-08-30 MED ORDER — MELOXICAM 15 MG PO TABS: 15.0000 mg | ORAL_TABLET | Freq: Every day | ORAL | 3 refills | Status: AC

## 2021-08-30 NOTE — Patient Instructions (Addendum)
Look for Voltaren gel at the pharmacy over the counter or online (also known as diclofenac 1% gel). Apply to the painful areas 3-4x daily with the supplied dosing card. Allow to dry for 10 minutes before going into socks/shoes   Peroneal Tendinopathy Rehab Ask your health care provider which exercises are safe for you. Do exercises exactly as told by your health care provider and adjust them as directed. It is normal to feel mild stretching, pulling, tightness, or discomfort as you do these exercises. Stop right away if you feel sudden pain or your pain gets worse. Do not begin these exercises until told by your health care provider. Stretching and range-of-motion exercises These exercises warm up your muscles and joints and improve the movement and flexibility of your ankle. These exercises also help to relieve pain and stiffness. Gastroc and soleus stretch, standing  This is an exercise in which you stand on a step and use your body weight to stretch your calf muscles. To do this exercise: Stand on the edge of a step on the ball of your left / right foot. The ball of your foot is on the walking surface, right under your toes. Keep your other foot firmly on the same step. Hold on to the wall, a railing, or a chair for balance. Slowly lift your other foot, allowing your body weight to press your left / right heel down over the edge of the step. You should feel a stretch in your left / right calf (gastrocnemius and soleus). Hold this position for 15 seconds. Return both feet to the step. Repeat this exercise with a slight bend in your left / right knee. Repeat 5 times with your left / right knee straight and 5 times with your left / right knee bent. Complete this exercise 2 times a day. Strengthening exercises These exercises build strength and endurance in your foot and ankle. Endurance is the ability to use your muscles for a long time, even after they get tired. Ankle dorsiflexion with  band   Secure a rubber exercise band or tube to an object, such as a table leg, that will not move when the band is pulled. Secure the other end of the band around your left / right foot. Sit on the floor, facing the object with your left / right leg extended. The band or tube should be slightly tense when your foot is relaxed. Slowly flex your left / right ankle and toes to bring your foot toward you (dorsiflexion). Hold this position for 15 seconds. Let the band or tube slowly pull your foot back to the starting position. Repeat 5 times. Complete this exercise 2 times a day. Ankle eversion Sit on the floor with your legs straight out in front of you. Loop a rubber exercise band or tube around the ball of your left / right foot. The ball of your foot is on the walking surface, right under your toes. Hold the ends of the band in your hands, or secure the band to a stable object. The band or tube should be slightly tense when your foot is relaxed. Slowly push your foot outward, away from your other leg (eversion). Hold this position for 15 seconds. Slowly return your foot to the starting position. Repeat 5 times. Complete this exercise 2 times a day. Plantar flexion, standing  This exercise is sometimes called standing heel raise. Stand with your feet shoulder-width apart. Place your hands on a wall or table to steady yourself as   needed, but try not to use it for support. Keep your weight spread evenly over the width of your feet while you slowly rise up on your toes (plantar flexion). If told by your health care provider: Shift your weight toward your left / right leg until you feel challenged. Stand on your left / right leg only. Hold this position for 15 seconds. Repeat 2 times. Complete this exercise 2 times a day. Single leg stand Without shoes, stand near a railing or in a doorway. You may hold on to the railing or door frame as needed. Stand on your left / right foot. Keep your  big toe down on the floor and try to keep your arch lifted. Do not roll to the outside of your foot. If this exercise is too easy, you can try it with your eyes closed or while standing on a pillow. Hold this position for 15 seconds. Repeat 5 times. Complete this exercise 2 times a day. This information is not intended to replace advice given to you by your health care provider. Make sure you discuss any questions you have with your health care provider. Document Revised: 07/29/2018 Document Reviewed: 07/29/2018 Elsevier Patient Education  2020 [employer]  

## 2021-08-30 NOTE — Progress Notes (Signed)
?  Subjective:  ?Patient ID: Zachary Walsh, male    DOB: 12-Sep-1960,  MRN: [id number] ? ?Chief Complaint  ?Patient presents with  ? Foot Pain  ?  (NP) R foot pain, side near heel, bottom  ? ? ?61 y.o. male presents with the above complaint. History confirmed with patient.  He has had a  pain rating to the outside edges of the lateral heel for about 3 weeks. ? ?Objective:  ?Physical Exam: ?warm, good capillary refill, no trophic changes or ulcerative lesions, normal DP and PT pulses, normal sensory exam, and pain on the peroneal sulcus, pain with resisted eversion is 5 out of 5 strength. ? ? ?Radiographs: ?Multiple views x-ray of the right foot: no fracture, dislocation, swelling or degenerative changes noted and there is an os peroneal in the area of pain ?Assessment:  ? ?1. Peroneal tendinitis of right lower extremity   ? ? ? ?Plan:  ?Patient was evaluated and treated and all questions answered. ? ?I suspect he has peroneal tendinitis as well as possibly an inflamed os peroneum.  We discussed the option of a corticosteroid injection which he preferred to avoid for now, I did recommend oral anti-inflammatories with meloxicam and this was sent to his pharmacy.  We discussed its use and possible side effects.  We did support it with a walking boot to immobilize the tendons to allow them to rest.  I also recommend home physical therapy and a exercise plan was given to him.  I think if not improving next visit we should consider corticosteroid injection. ? ?Return in about 6 weeks (around 10/11/2021) for re-check peroneal tendinitis.  ? ?

## 2021-08-31 ENCOUNTER — Telehealth: Payer: Self-pay | Admitting: Podiatry

## 2021-08-31 DIAGNOSIS — M79676 Pain in unspecified toe(s): Secondary | ICD-10-CM

## 2021-08-31 NOTE — Telephone Encounter (Signed)
Pt is calling and was seen yesterday and he is back at work today but his job requires him to have an closed toe shoe. Is there a different boot for him to wear or what are your recommendations? ? ?Please advise. ?

## 2021-08-31 NOTE — Telephone Encounter (Signed)
Patient came by [facility] office to pick up [employer] ?

## 2021-09-04 ENCOUNTER — Ambulatory Visit: Payer: 59 | Admitting: Podiatry

## 2021-09-04 ENCOUNTER — Encounter: Payer: Self-pay | Admitting: Podiatry

## 2021-09-04 DIAGNOSIS — M7671 Peroneal tendinitis, right leg: Secondary | ICD-10-CM | POA: Diagnosis not present

## 2021-09-04 NOTE — Progress Notes (Signed)
?  Subjective:  ?Patient ID: Zachary Walsh, male    DOB: 17-Jul-1960,  MRN: [id number] ? ?Chief Complaint  ?Patient presents with  ? Tendonitis  ?  Follow up right foot - injection  ? ? ?61 y.o. male presents with the above complaint. History confirmed with patient.  Still quite painful.  He is here because he would like an injection today.  He is unable to wear the cam boot at work but has transition back to a Tri-Lock ankle brace. ? ?Objective:  ?Physical Exam: ?warm, good capillary refill, no trophic changes or ulcerative lesions, normal DP and PT pulses, normal sensory exam, and pain on the peroneal sulcus, pain with resisted eversion is 5 out of 5 strength. ? ? ?Radiographs: ?Multiple views x-ray of the right foot: no fracture, dislocation, swelling or degenerative changes noted and there is an os peroneal in the area of pain ?Assessment:  ? ?1. Peroneal tendinitis of right lower extremity   ? ? ? ?Plan:  ?Patient was evaluated and treated and all questions answered. ? ?Agreed with injection therapy today as he is still having quite a bit of pain.  I discussed with him the risks and benefits of injection including infection and possible damage to the tendon.  Advised him to rest is much as possible.  He will wear the cam boot outside of work in the Tri-Lock ankle brace at work.  Avoid impact activity and exercise for now.  Following verbal consent the peroneal sulcus was injected with 2 mg of dexamethasone and 5 mg of Kenalog and 1/2 cc each of 2% lidocaine and half percent Marcaine plain.  He tolerated well.  I will see him back at his scheduled visit. ? ?No follow-ups on file.  ? ?

## 2021-10-11 ENCOUNTER — Ambulatory Visit (INDEPENDENT_AMBULATORY_CARE_PROVIDER_SITE_OTHER): Payer: Self-pay | Admitting: Podiatry

## 2021-10-11 DIAGNOSIS — Z91199 Patient's noncompliance with other medical treatment and regimen due to unspecified reason: Secondary | ICD-10-CM

## 2021-10-27 IMAGING — DX DG RIBS W/ CHEST 3+V*L*
3 series · 3 of 3 positions shown · non-contrast
Comparison: Chest x-ray dated March 23, 2018.

CLINICAL DATA: Left-sided rib pain status post fall.

EXAM:
LEFT RIBS AND CHEST - 3+ VIEW

[chest pa]
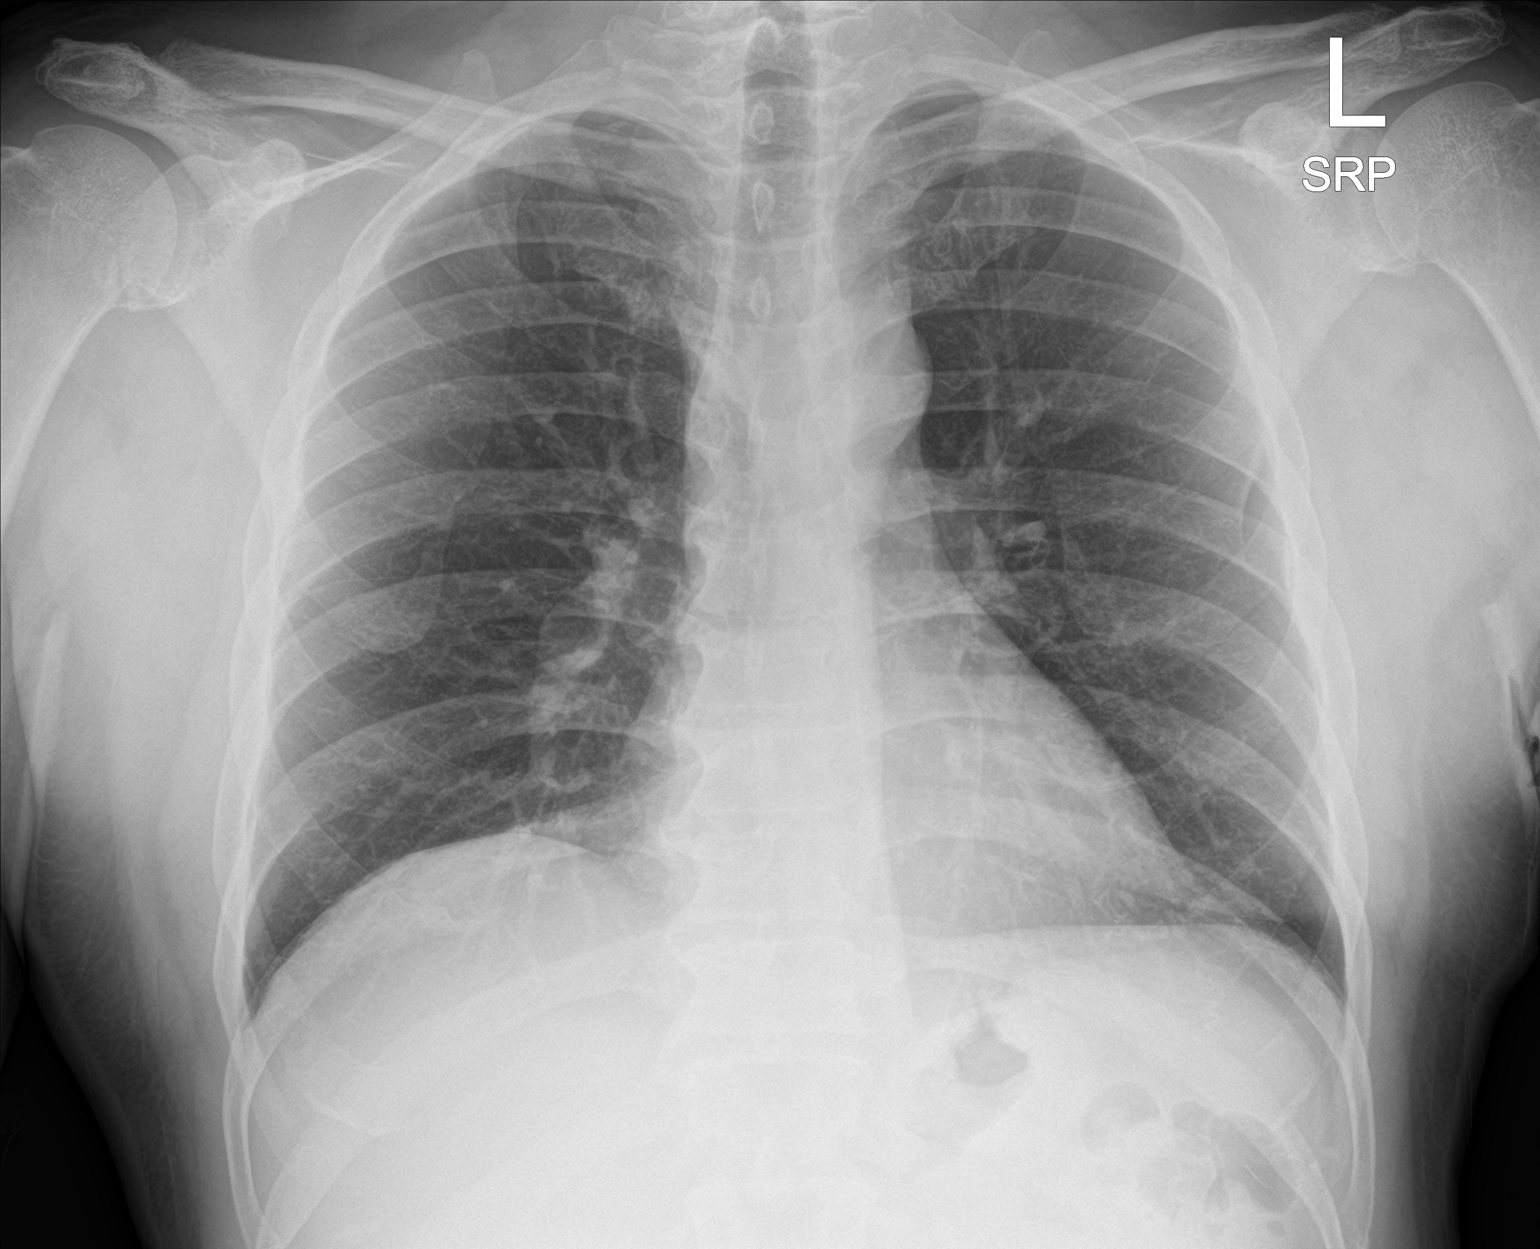

[rib obl]
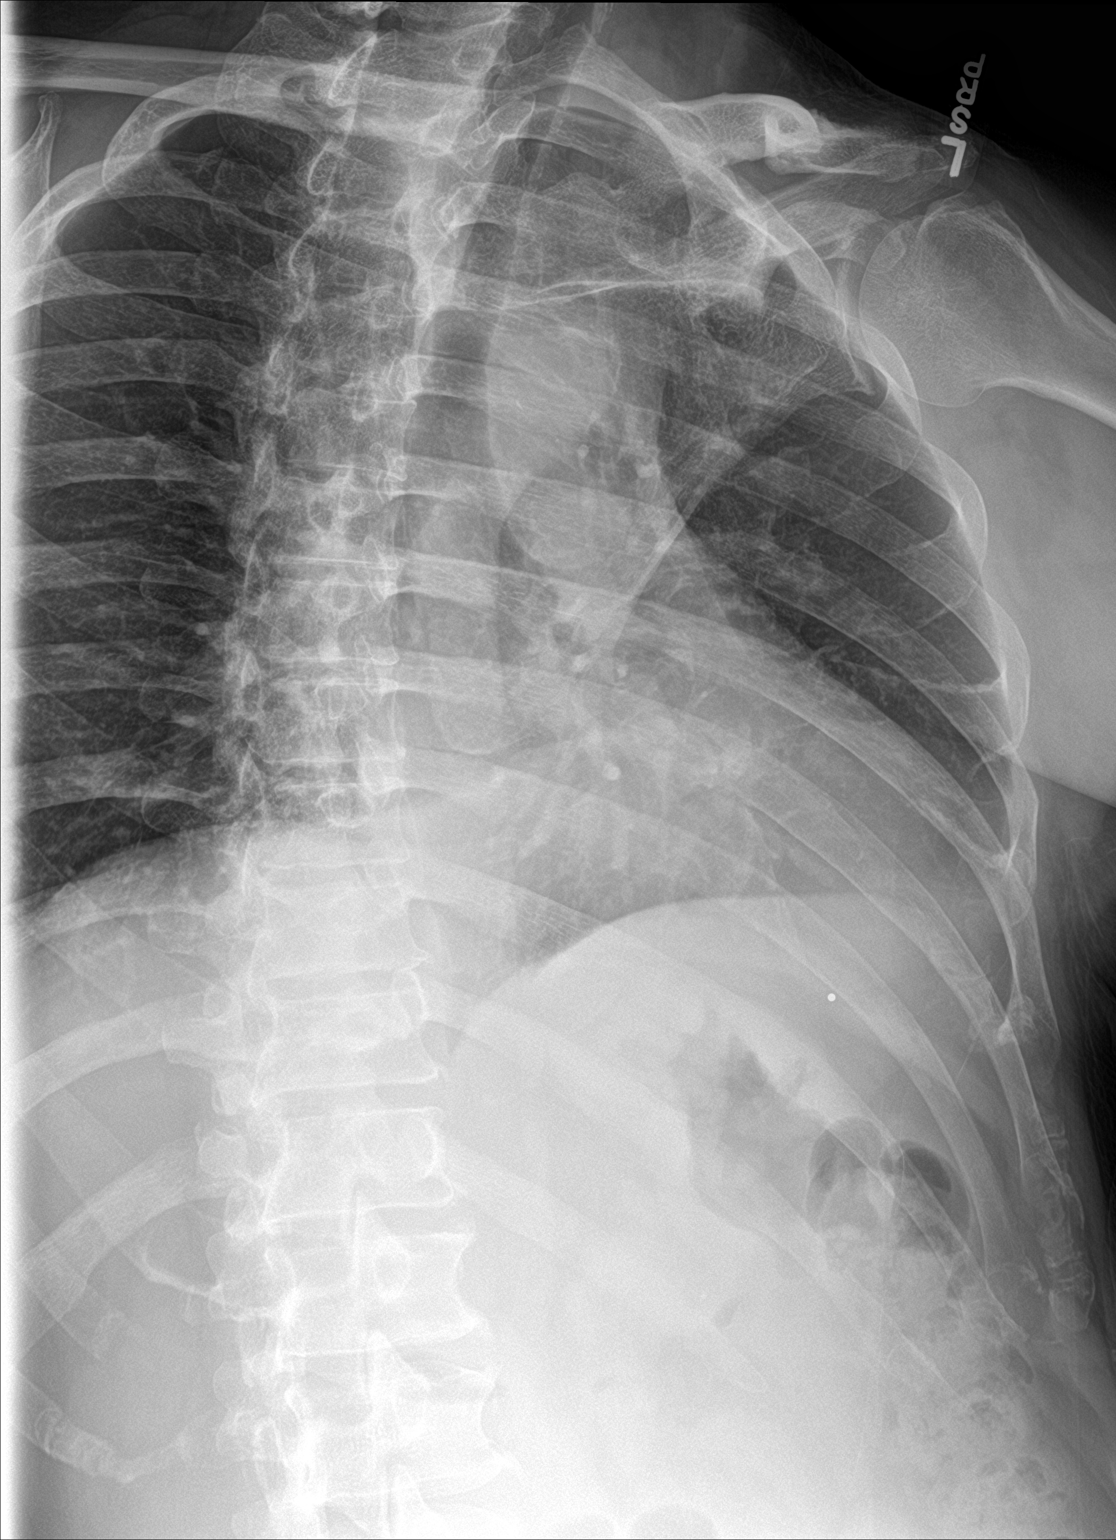

[rib pa]
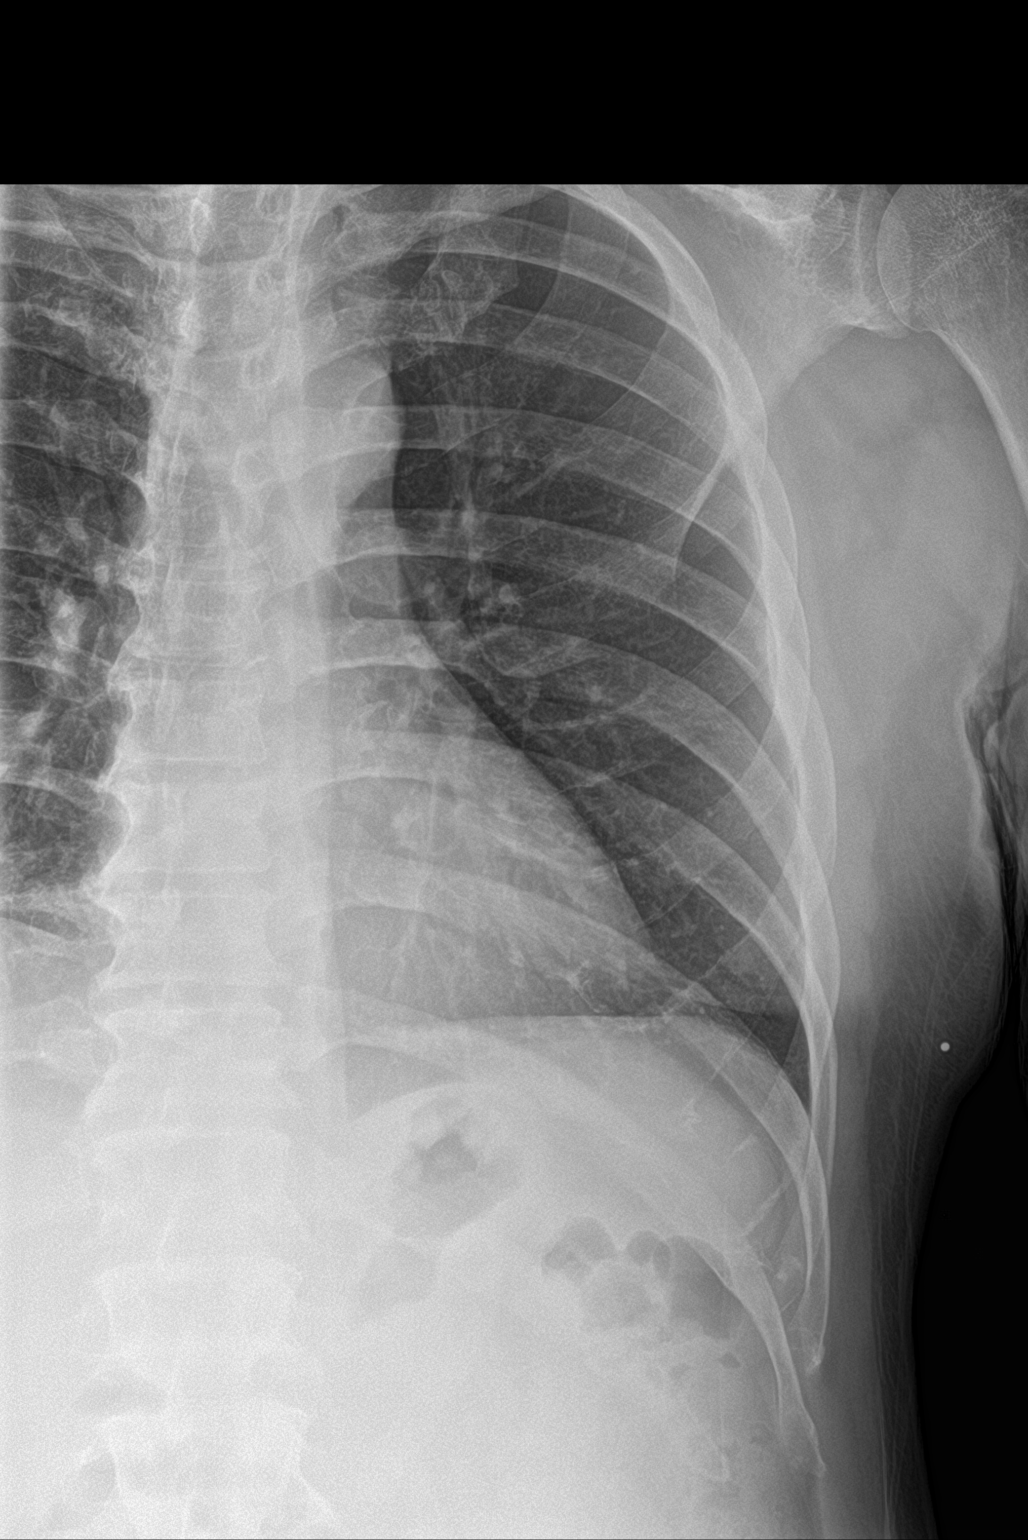

[3 of 3 positions shown; findings below may reference images not displayed]

FINDINGS: No fracture or other bone lesions are seen involving the ribs. There
is no evidence of pneumothorax or pleural effusion. Both lungs are
clear. Heart size and mediastinal contours are within normal limits.
IMPRESSION: Negative.

## 2021-11-01 IMAGING — DX DG FOOT COMPLETE 3+V*R*
3 series · 3 of 3 positions shown · non-contrast
Comparison: None.

CLINICAL DATA: Toe injury

EXAM:
RIGHT FOOT COMPLETE - 3+ VIEW

[foot ap]
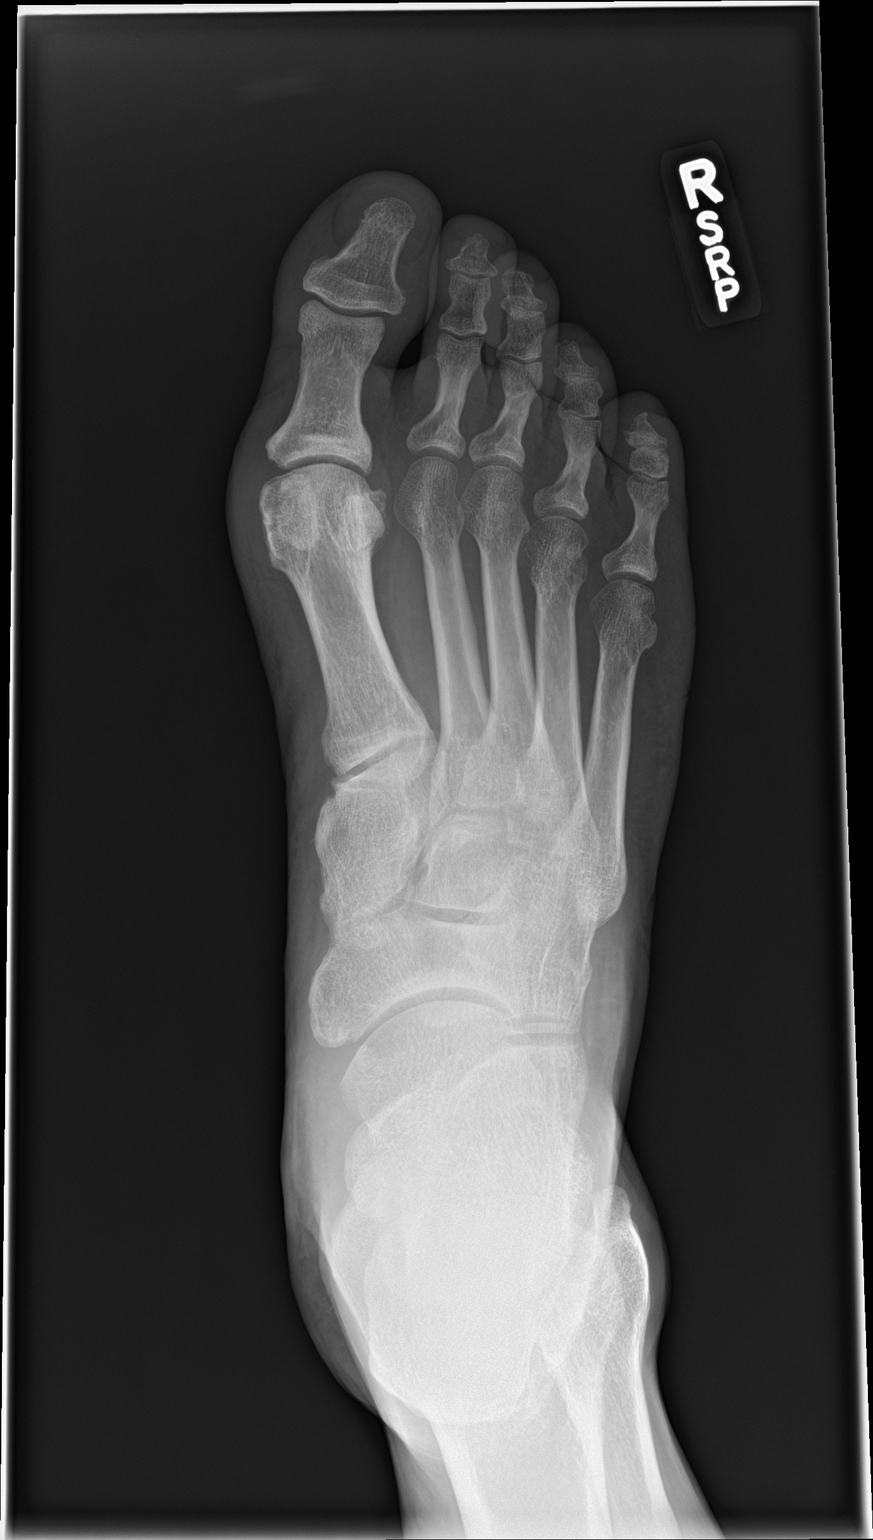

[foot obl]
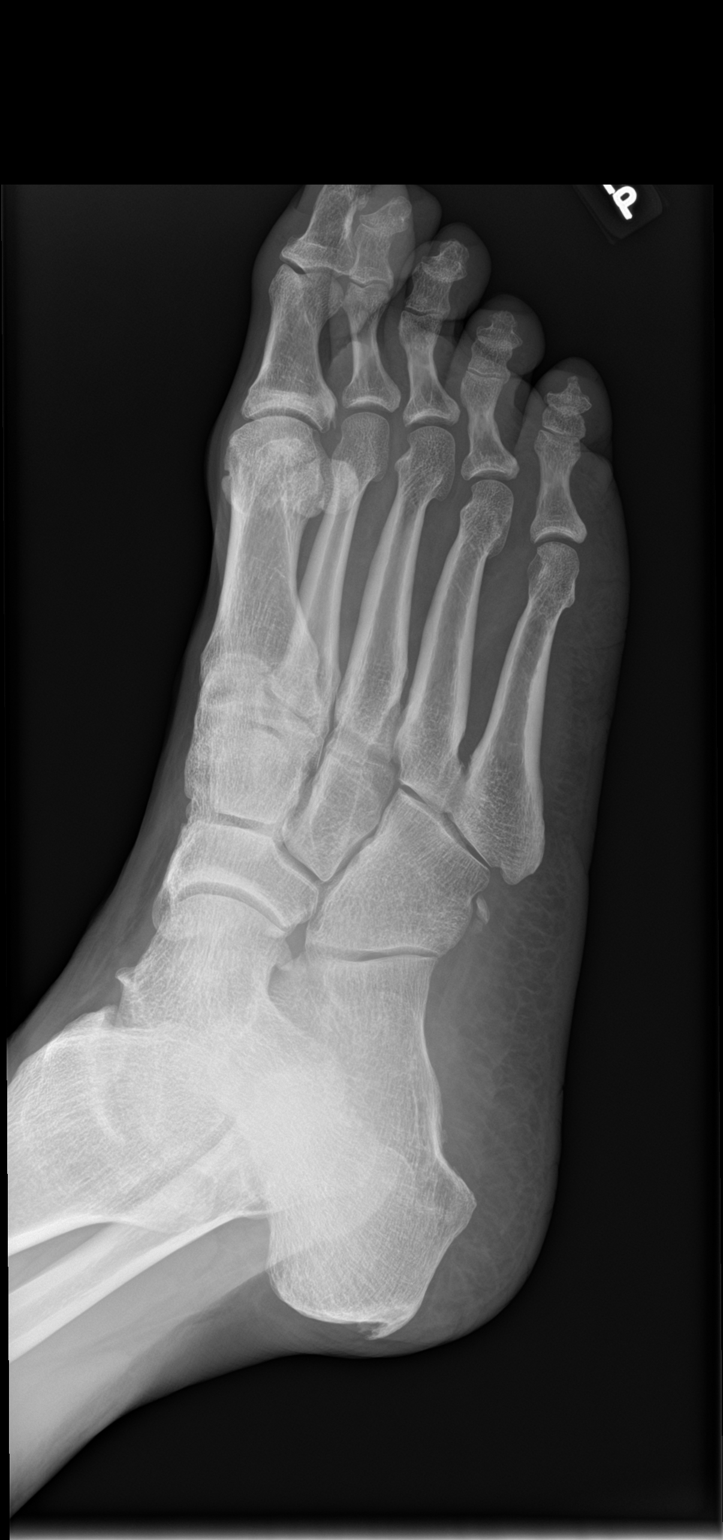

[foot lat]
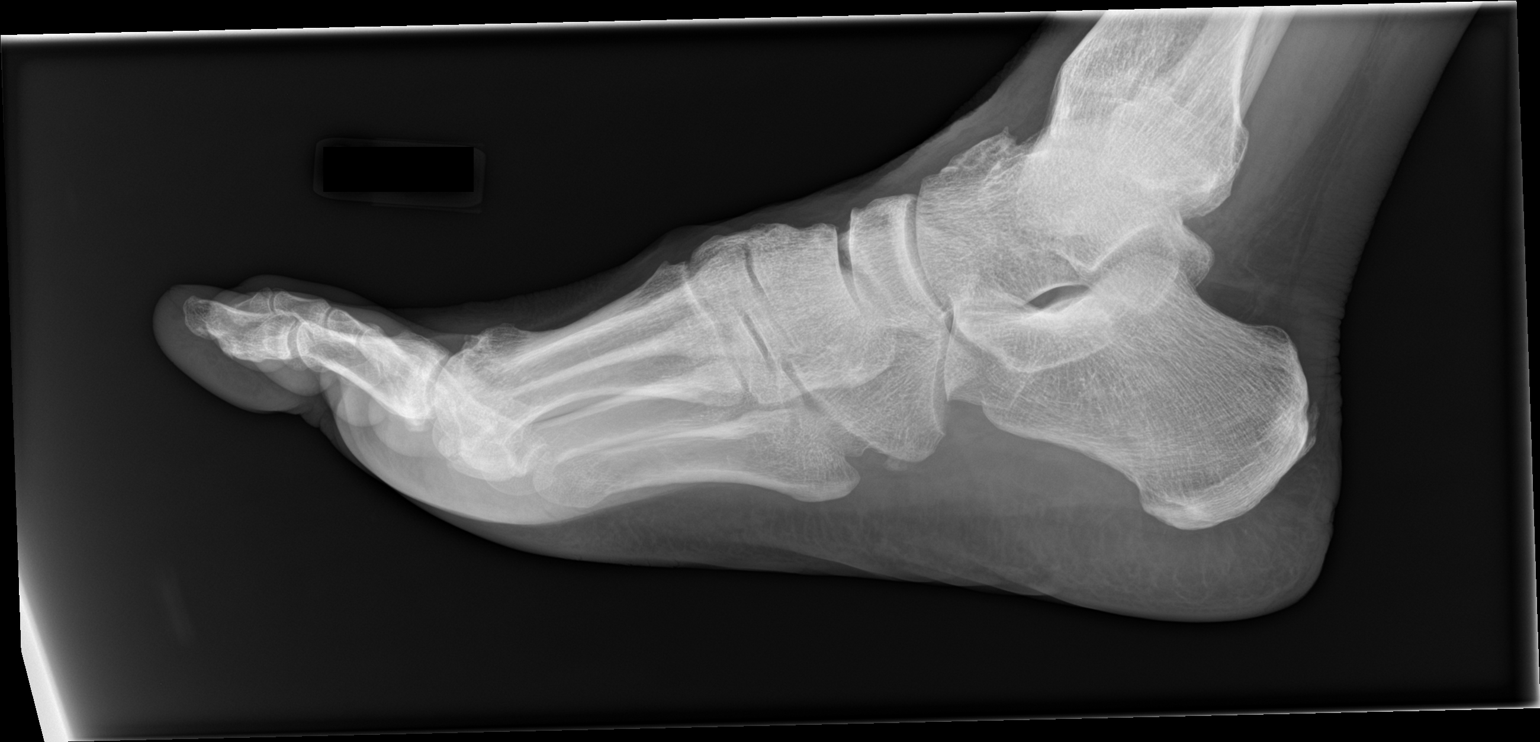

[3 of 3 positions shown; findings below may reference images not displayed]

FINDINGS: Negative for fracture. Mild hallux valgus with mild joint space
narrowing. Mild soft tissue swelling medial to the first MTP
consistent with bursitis.
IMPRESSION: Negative for fracture.

## 2023-10-13 ENCOUNTER — Other Ambulatory Visit: Payer: Self-pay

## 2023-10-13 ENCOUNTER — Emergency Department (HOSPITAL_COMMUNITY)
Admission: EM | Admit: 2023-10-13 | Discharge: 2023-10-14 | Disposition: A | Discharge status: Home / Self Care | Attending: Emergency Medicine | Admitting: Emergency Medicine

## 2023-10-13 ENCOUNTER — Encounter (HOSPITAL_COMMUNITY): Payer: Self-pay

## 2023-10-13 DIAGNOSIS — M5432 Sciatica, left side: Secondary | ICD-10-CM

## 2023-10-13 DIAGNOSIS — M5442 Lumbago with sciatica, left side: Secondary | ICD-10-CM | POA: Diagnosis not present

## 2023-10-13 DIAGNOSIS — M545 Low back pain, unspecified: Secondary | ICD-10-CM | POA: Diagnosis present

## 2023-10-13 NOTE — ED Triage Notes (Signed)
 Complaining of pain that starts in the left lower back/buttock area and shoots down that left leg. It is making the thigh and knee ache. Can not find a comfortable position. Started Friday and is not getting better despite over the counter medication and stretches.

## 2023-10-14 ENCOUNTER — Telehealth (HOSPITAL_COMMUNITY): Payer: Self-pay | Admitting: Physician Assistant

## 2023-10-14 MED ORDER — LIDOCAINE 5 % EX PTCH
1.0000 | MEDICATED_PATCH | CUTANEOUS | Status: DC
  Administered 2023-10-14: 1 via TRANSDERMAL
  Filled 2023-10-14: qty 1

## 2023-10-14 MED ORDER — LIDOCAINE 5 % EX PTCH: 1.0000 | MEDICATED_PATCH | CUTANEOUS | 0 refills | Status: DC

## 2023-10-14 MED ORDER — NAPROXEN 500 MG PO TABS: 500.0000 mg | ORAL_TABLET | Freq: Two times a day (BID) | ORAL | 0 refills | Status: DC

## 2023-10-14 MED ORDER — HYDROCODONE-ACETAMINOPHEN 5-325 MG PO TABS
1.0000 | ORAL_TABLET | Freq: Once | ORAL | Status: AC
  Administered 2023-10-14: 1 via ORAL
  Filled 2023-10-14: qty 1

## 2023-10-14 MED ORDER — NAPROXEN 500 MG PO TABS: 500.0000 mg | ORAL_TABLET | Freq: Two times a day (BID) | ORAL | 0 refills | Status: AC

## 2023-10-14 MED ORDER — CYCLOBENZAPRINE HCL 10 MG PO TABS: 10.0000 mg | ORAL_TABLET | Freq: Two times a day (BID) | ORAL | 0 refills | Status: DC | PRN

## 2023-10-14 MED ORDER — LIDOCAINE 5 % EX PTCH: 1.0000 | MEDICATED_PATCH | CUTANEOUS | 0 refills | Status: AC

## 2023-10-14 MED ORDER — CYCLOBENZAPRINE HCL 10 MG PO TABS: 10.0000 mg | ORAL_TABLET | Freq: Two times a day (BID) | ORAL | 0 refills | Status: AC | PRN

## 2023-10-14 MED ORDER — CYCLOBENZAPRINE HCL 10 MG PO TABS
10.0000 mg | ORAL_TABLET | Freq: Once | ORAL | Status: AC
  Administered 2023-10-14: 10 mg via ORAL
  Filled 2023-10-14: qty 1

## 2023-10-14 MED ORDER — KETOROLAC TROMETHAMINE 15 MG/ML IJ SOLN
15.0000 mg | Freq: Once | INTRAMUSCULAR | Status: AC
  Administered 2023-10-14: 15 mg via INTRAMUSCULAR
  Filled 2023-10-14: qty 1

## 2023-10-14 NOTE — Discharge Instructions (Addendum)
 It was a pleasure taking part in your care.  As discussed, I believe that your symptoms are secondary to sciatica.  Please begin taking naproxen  twice a day as needed for pain.  Please begin taking Flexeril  twice a day as needed for muscle spasms.  Please do not drive or operate heavy machinery while taking Flexeril .  Do not mix with alcohol.  You may apply lidocaine patch to the left lower portion of your back for pain relief.  If lidocaine patches are too expensive with insurance, please purchase Salonpas patches.  Please follow-up with your PCP.  Return to the ED with any new or worsening symptoms.  Read attached guide concerning sciatica.

## 2023-10-14 NOTE — Telephone Encounter (Cosign Needed)
 Pharmacy change

## 2023-10-14 NOTE — ED Provider Notes (Signed)
 [place] EMERGENCY DEPARTMENT AT [facility] Provider Note   CSN: [id number] Arrival date & time: 10/13/23  2113     Patient presents with: Back Pain   Zachary Walsh is a 63 y.o. male with history of sciatica who presents to ED for evaluation of left low back pain.  States that for the last 3 days he has had left low back pain.  Reports that he has been taking medications at home without relief as well as stretching.  He reports a history of low back pain.  States that he has seen orthopedic doctors, neurosurgeons in the past for low back pain.  He reports that all of his symptoms were proceeded on Friday by pulling a cooler out of his truck.  He denies any distal weakness, groin numbness, bowel or bladder incontinence.  Denies IV drug use, fevers at home.  Reports that this feels very much like his typical sciatica.  States he is unable to find comfortable position at home.  No dysuria.  Denies any trauma.    Back Pain      Prior to Admission medications   Medication Sig Start Date End Date Taking? Authorizing Provider  cyclobenzaprine  (FLEXERIL ) 10 MG tablet Take 1 tablet (10 mg total) by mouth 2 (two) times daily as needed for muscle spasms. 10/14/23  Yes Ruthell Bruckner F, PA-C  lidocaine (LIDODERM) 5 % Place 1 patch onto the skin daily. Remove & Discard patch within 12 hours or as directed by MD 10/14/23  Yes Ruthell Bruckner FALCON, PA-C  naproxen  (NAPROSYN ) 500 MG tablet Take 1 tablet (500 mg total) by mouth 2 (two) times daily. 10/14/23  Yes Ruthell Bruckner FALCON, PA-C  acetic acid  2 % otic solution Place 4 drops into the right ear 4 (four) times daily. 07/28/20   LampteyAleene KIDD, MD  amitriptyline (ELAVIL) 25 MG tablet amitriptyline 25 mg tablet    [provider]  ammonium lactate  (LAC-HYDRIN ) 12 % cream Apply topically in the morning and at bedtime. For dry skin on feet 08/02/19   Maranda Jamee Jacob, MD  busPIRone  (BUSPAR ) 5 MG tablet Take 5 mg by mouth 3  (three) times daily. 07/25/21   [provider]  Continuous Blood Gluc Receiver (FREESTYLE LIBRE 14 DAY READER) DEVI 1 Device by Does not apply route 3 (three) times daily. E11.65 Use for continuous blood glucose monitoring 05/23/17   Bettie Banks A, MD  Continuous Blood Gluc Sensor (FREESTYLE LIBRE 14 DAY SENSOR) MISC 1 Device by Does not apply route 3 (three) times daily. E11.65 05/23/17   Stallings, Zoe A, MD  glucagon  1 MG injection Inject 1 mg into the vein once as needed for up to 1 dose. 08/21/17   Stallings, Zoe A, MD  glyBURIDE  (DIABETA ) 5 MG tablet glyburide  5 mg tablet  TAKE TWO TABLETS BY MOUTH IN THE MORNING.    [provider]  lisinopril -hydrochlorothiazide  (ZESTORETIC ) 20-12.5 MG tablet Take one tab daily 11/16/18   Arloa Suzen RAMAN, NP  meloxicam  (MOBIC ) 15 MG tablet Take 1 tablet (15 mg total) by mouth daily. 08/30/21   McDonald, Juliene SAUNDERS, DPM  metFORMIN  (GLUCOPHAGE ) 1000 MG tablet Take 1 tablet (1,000 mg total) by mouth 2 (two) times daily with a meal. 12/16/20 01/15/21  Raspet, Erin K, PA-C  metFORMIN  (GLUCOPHAGE ) 1000 MG tablet metformin  1,000 mg tablet  TAKE 1 TABLET (1,000 MG TOTAL) BY MOUTH 2 (TWO) TIMES DAILY WITH A MEAL.    [provider]  methocarbamol  (ROBAXIN ) 500  MG tablet Take 1 tablet (500 mg total) by mouth 4 (four) times daily. 11/02/19   Sofia, Leslie K, PA-C  montelukast (SINGULAIR) 10 MG tablet montelukast 10 mg tablet  TAKE 1 TABLET BY MOUTH EVERYDAY AT BEDTIME    [provider]  nateglinide (STARLIX) 120 MG tablet nateglinide 120 mg tablet    [provider]  rosuvastatin (CRESTOR) 20 MG tablet Crestor 20 mg tablet  Take 1 tablet every day by oral route as directed for 30 days.    [provider]  Semaglutide, 2 MG/DOSE, (OZEMPIC, 2 MG/DOSE,) 8 MG/3ML SOPN Ozempic 2 mg/dose (8 mg/3 mL) subcutaneous pen injector  INJECT 2MG  SUBCUTANEOUSLY EVERY WEEK (ON A SUNDAY) AS DIRECTED.    [provider]   sertraline  (ZOLOFT ) 100 MG tablet sertraline  100 mg tablet  TAKE 1 TABLET BY MOUTH EVERY DAY    [provider]  sertraline  (ZOLOFT ) 50 MG tablet Take 1 tablet (50 mg total) by mouth daily. 11/16/18 12/16/18  Arloa Suzen RAMAN, NP    Allergies: Benadryl [diphenhydramine hcl]    Review of Systems  Musculoskeletal:  Positive for back pain.  All other systems reviewed and are negative.   Updated Vital Signs BP 138/89   Pulse 86   Temp 98.3 F (36.8 C)   Resp 16   Ht 5' 11 (1.803 m)   Wt 93.4 kg   SpO2 99%   BMI 28.73 kg/m   Physical Exam Vitals and nursing note reviewed.  Constitutional:      General: He is not in acute distress.    Appearance: He is well-developed.  HENT:     Head: Normocephalic and atraumatic.   Eyes:     Conjunctiva/sclera: Conjunctivae normal.    Cardiovascular:     Rate and Rhythm: Normal rate and regular rhythm.     Heart sounds: No murmur heard. Pulmonary:     Effort: Pulmonary effort is normal. No respiratory distress.     Breath sounds: Normal breath sounds.  Abdominal:     Palpations: Abdomen is soft.     Tenderness: There is no abdominal tenderness.   Musculoskeletal:        General: No swelling.     Cervical back: Neck supple.       Back:   Skin:    General: Skin is warm and dry.     Capillary Refill: Capillary refill takes less than 2 seconds.   Neurological:     Mental Status: He is alert and oriented to person, place, and time. Mental status is at baseline.     Comments: 5 out of 5 strength of bilateral lower extremities  Psychiatric:        Mood and Affect: Mood normal.     (all labs ordered are listed, but only abnormal results are displayed) Labs Reviewed - No data to display  EKG: None  Radiology: No results found.   Procedures   Medications Ordered in the ED  lidocaine (LIDODERM) 5 % 1 patch (1 patch Transdermal Patch Applied 10/14/23 0245)  lidocaine (LIDODERM) 5 % 1 patch (1 patch Transdermal  Patch Applied 10/14/23 0444)  HYDROcodone -acetaminophen  (NORCO/VICODIN) 5-325 MG per tablet 1 tablet (1 tablet Oral Given 10/14/23 0245)  ketorolac (TORADOL) 15 MG/ML injection 15 mg (15 mg Intramuscular Given 10/14/23 0442)  cyclobenzaprine  (FLEXERIL ) tablet 10 mg (10 mg Oral Given 10/14/23 0442)    Medical Decision Making Risk Prescription drug management.   63 year old male presents for evaluation.  Please see HPI for  further details.  On examination is afebrile and nontachycardic.  His lung sounds are clear bilaterally, he is not hypoxic.  Abdomen is soft and compressible.  Neurological examination at baseline.  5 out of 5 strength of bilateral lower extremities.  Tenderness to left low back.  Patient with history of low back pain.  5 out of 5 strength bilateral lower extremities.  Denies any red flag symptoms of low back pain.  No indication for imaging at this time, the patient denies trauma.  Patient treated with Toradol, Flexeril , lidocaine patch.  Reports relief of symptoms at this time. Noted to be resting comfortably in hospital bed. Will discharge patient home naproxen , Flexeril  and Lidoderm patches.  Have advised him to follow-up with PCP.  He voiced understanding.  Return precautions given and he voiced understanding.  Stable to discharge.   Final diagnoses:  Sciatica of left side    ED Discharge Orders          Ordered    naproxen  (NAPROSYN ) 500 MG tablet  2 times daily        10/14/23 0532    cyclobenzaprine  (FLEXERIL ) 10 MG tablet  2 times daily PRN        10/14/23 0532    lidocaine (LIDODERM) 5 %  Every 24 hours        10/14/23 0532               Ruthell Lonni FALCON, PA-C 10/14/23 0547    Griselda Norris, MD 10/15/23 [phone]
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
## 2023-11-14 ENCOUNTER — Other Ambulatory Visit: Payer: Self-pay

## 2023-11-14 ENCOUNTER — Ambulatory Visit: Attending: Pediatrics | Admitting: Physical Therapy

## 2023-11-14 ENCOUNTER — Encounter: Payer: Self-pay | Admitting: Physical Therapy

## 2023-11-14 DIAGNOSIS — M5459 Other low back pain: Secondary | ICD-10-CM | POA: Diagnosis present

## 2023-11-14 DIAGNOSIS — R2689 Other abnormalities of gait and mobility: Secondary | ICD-10-CM | POA: Diagnosis present

## 2023-11-14 DIAGNOSIS — M25552 Pain in left hip: Secondary | ICD-10-CM | POA: Insufficient documentation

## 2023-11-21 ENCOUNTER — Ambulatory Visit: Admitting: Physical Therapy
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
## 2023-11-25 ENCOUNTER — Telehealth: Payer: Self-pay | Admitting: Physical Therapy

## 2023-11-25 ENCOUNTER — Ambulatory Visit: Attending: Family Medicine | Admitting: Physical Therapy

## 2023-11-25 NOTE — Telephone Encounter (Signed)
 Called pt re: today's missed appt - left voicemail w/ date/time of next appt, advised on attendance policy with second missed appointment, provided office call back number and encouraged to reach out with any questions/concerns.
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
## 2023-11-28 ENCOUNTER — Ambulatory Visit

## 2023-12-03 ENCOUNTER — Ambulatory Visit

## 2023-12-05 ENCOUNTER — Ambulatory Visit

## 2023-12-10 ENCOUNTER — Encounter

## 2023-12-12 ENCOUNTER — Encounter
